# Patient Record
Sex: Female | Born: 1950 | Hispanic: No | Smoking: Current every day smoker
Health system: Southern US, Community
[De-identification: ages and names within clinical notes are randomized; demographics above are authoritative.]

## PROBLEM LIST (undated history)

## (undated) DIAGNOSIS — K589 Irritable bowel syndrome without diarrhea: Secondary | ICD-10-CM

## (undated) DIAGNOSIS — I700 Atherosclerosis of aorta: Secondary | ICD-10-CM

## (undated) HISTORY — DX: Morbid (severe) obesity due to excess calories: E66.01

## (undated) HISTORY — PX: WRIST SURGERY: SHX841

## (undated) HISTORY — PX: NEPHRECTOMY: SHX65

## (undated) HISTORY — PX: RADICAL HYSTERECTOMY: SHX2283

## (undated) HISTORY — DX: Atherosclerosis of aorta: I70.0

## (undated) HISTORY — DX: Irritable bowel syndrome, unspecified: K58.9

---

## 2020-02-18 ENCOUNTER — Inpatient Hospital Stay (HOSPITAL_COMMUNITY): Payer: Medicare Other

## 2020-02-18 ENCOUNTER — Inpatient Hospital Stay (HOSPITAL_COMMUNITY): Payer: Medicare Other | Admitting: Anesthesiology

## 2020-02-18 ENCOUNTER — Encounter (HOSPITAL_COMMUNITY): Payer: Self-pay | Admitting: Neurology

## 2020-02-18 ENCOUNTER — Encounter (HOSPITAL_COMMUNITY)
Admission: EM | Disposition: A | Discharge status: Home Health | Payer: Self-pay | Source: Home / Self Care | Attending: Neurology

## 2020-02-18 ENCOUNTER — Emergency Department (HOSPITAL_COMMUNITY): Payer: Medicare Other

## 2020-02-18 ENCOUNTER — Other Ambulatory Visit: Payer: Self-pay

## 2020-02-18 ENCOUNTER — Inpatient Hospital Stay (HOSPITAL_COMMUNITY)
Admission: EM | Admit: 2020-02-18 | Discharge: 2020-02-20 | DRG: 024 | Disposition: A | Discharge status: Home Health | Payer: Medicare Other | Attending: Neurology | Admitting: Neurology

## 2020-02-18 DIAGNOSIS — R29810 Facial weakness: Secondary | ICD-10-CM | POA: Diagnosis present

## 2020-02-18 DIAGNOSIS — Z7982 Long term (current) use of aspirin: Secondary | ICD-10-CM

## 2020-02-18 DIAGNOSIS — I6389 Other cerebral infarction: Secondary | ICD-10-CM | POA: Diagnosis not present

## 2020-02-18 DIAGNOSIS — E785 Hyperlipidemia, unspecified: Secondary | ICD-10-CM | POA: Diagnosis present

## 2020-02-18 DIAGNOSIS — R29703 NIHSS score 3: Secondary | ICD-10-CM | POA: Diagnosis present

## 2020-02-18 DIAGNOSIS — I6622 Occlusion and stenosis of left posterior cerebral artery: Secondary | ICD-10-CM

## 2020-02-18 DIAGNOSIS — I674 Hypertensive encephalopathy: Secondary | ICD-10-CM | POA: Diagnosis present

## 2020-02-18 DIAGNOSIS — I63432 Cerebral infarction due to embolism of left posterior cerebral artery: Principal | ICD-10-CM | POA: Diagnosis present

## 2020-02-18 DIAGNOSIS — Z8249 Family history of ischemic heart disease and other diseases of the circulatory system: Secondary | ICD-10-CM | POA: Diagnosis not present

## 2020-02-18 DIAGNOSIS — Z20822 Contact with and (suspected) exposure to covid-19: Secondary | ICD-10-CM | POA: Diagnosis present

## 2020-02-18 DIAGNOSIS — R4781 Slurred speech: Secondary | ICD-10-CM | POA: Diagnosis present

## 2020-02-18 DIAGNOSIS — Z91048 Other nonmedicinal substance allergy status: Secondary | ICD-10-CM

## 2020-02-18 DIAGNOSIS — M545 Low back pain: Secondary | ICD-10-CM | POA: Diagnosis present

## 2020-02-18 DIAGNOSIS — I639 Cerebral infarction, unspecified: Secondary | ICD-10-CM | POA: Diagnosis present

## 2020-02-18 DIAGNOSIS — F17210 Nicotine dependence, cigarettes, uncomplicated: Secondary | ICD-10-CM | POA: Diagnosis present

## 2020-02-18 DIAGNOSIS — G8191 Hemiplegia, unspecified affecting right dominant side: Secondary | ICD-10-CM | POA: Diagnosis present

## 2020-02-18 DIAGNOSIS — Z91018 Allergy to other foods: Secondary | ICD-10-CM

## 2020-02-18 DIAGNOSIS — Z79890 Hormone replacement therapy (postmenopausal): Secondary | ICD-10-CM

## 2020-02-18 DIAGNOSIS — I63339 Cerebral infarction due to thrombosis of unspecified posterior cerebral artery: Secondary | ICD-10-CM

## 2020-02-18 DIAGNOSIS — I63332 Cerebral infarction due to thrombosis of left posterior cerebral artery: Secondary | ICD-10-CM

## 2020-02-18 HISTORY — PX: IR PERCUTANEOUS ART THROMBECTOMY/INFUSION INTRACRANIAL INC DIAG ANGIO: IMG6087

## 2020-02-18 HISTORY — DX: Cerebral infarction, unspecified: I63.9

## 2020-02-18 HISTORY — DX: Occlusion and stenosis of left posterior cerebral artery: I66.22

## 2020-02-18 HISTORY — PX: RADIOLOGY WITH ANESTHESIA: SHX6223

## 2020-02-18 HISTORY — PX: IR CT HEAD LTD: IMG2386

## 2020-02-18 LAB — CBC
HCT: 46.3 % — ABNORMAL HIGH (ref 36.0–46.0)
Hemoglobin: 15 g/dL (ref 12.0–15.0)
MCH: 31.7 pg (ref 26.0–34.0)
MCHC: 32.4 g/dL (ref 30.0–36.0)
MCV: 97.9 fL (ref 80.0–100.0)
Platelets: 327 10*3/uL (ref 150–400)
RBC: 4.73 MIL/uL (ref 3.87–5.11)
RDW: 14.6 % (ref 11.5–15.5)
WBC: 8.1 10*3/uL (ref 4.0–10.5)
nRBC: 0 % (ref 0.0–0.2)

## 2020-02-18 LAB — DIFFERENTIAL
Abs Immature Granulocytes: 0.02 10*3/uL (ref 0.00–0.07)
Basophils Absolute: 0.1 10*3/uL (ref 0.0–0.1)
Basophils Relative: 1 %
Eosinophils Absolute: 0.1 10*3/uL (ref 0.0–0.5)
Eosinophils Relative: 2 %
Immature Granulocytes: 0 %
Lymphocytes Relative: 29 %
Lymphs Abs: 2.4 10*3/uL (ref 0.7–4.0)
Monocytes Absolute: 0.6 10*3/uL (ref 0.1–1.0)
Monocytes Relative: 7 %
Neutro Abs: 4.9 10*3/uL (ref 1.7–7.7)
Neutrophils Relative %: 61 %

## 2020-02-18 LAB — URINALYSIS, ROUTINE W REFLEX MICROSCOPIC
Bilirubin Urine: NEGATIVE
Glucose, UA: NEGATIVE mg/dL
Hgb urine dipstick: NEGATIVE
Ketones, ur: NEGATIVE mg/dL
Leukocytes,Ua: NEGATIVE
Nitrite: POSITIVE — AB
Protein, ur: NEGATIVE mg/dL
Specific Gravity, Urine: 1.033 — ABNORMAL HIGH (ref 1.005–1.030)
pH: 6 (ref 5.0–8.0)

## 2020-02-18 LAB — I-STAT CHEM 8, ED
BUN: 18 mg/dL (ref 8–23)
Calcium, Ion: 1.12 mmol/L — ABNORMAL LOW (ref 1.15–1.40)
Chloride: 107 mmol/L (ref 98–111)
Creatinine, Ser: 0.8 mg/dL (ref 0.44–1.00)
Glucose, Bld: 96 mg/dL (ref 70–99)
HCT: 45 % (ref 36.0–46.0)
Hemoglobin: 15.3 g/dL — ABNORMAL HIGH (ref 12.0–15.0)
Potassium: 4.8 mmol/L (ref 3.5–5.1)
Sodium: 138 mmol/L (ref 135–145)
TCO2: 23 mmol/L (ref 22–32)

## 2020-02-18 LAB — COMPREHENSIVE METABOLIC PANEL
ALT: 12 U/L (ref 0–44)
AST: 20 U/L (ref 15–41)
Albumin: 3.2 g/dL — ABNORMAL LOW (ref 3.5–5.0)
Alkaline Phosphatase: 79 U/L (ref 38–126)
Anion gap: 9 (ref 5–15)
BUN: 15 mg/dL (ref 8–23)
CO2: 24 mmol/L (ref 22–32)
Calcium: 9.2 mg/dL (ref 8.9–10.3)
Chloride: 105 mmol/L (ref 98–111)
Creatinine, Ser: 0.93 mg/dL (ref 0.44–1.00)
GFR calc Af Amer: >60 mL/min (ref >60)
GFR calc non Af Amer: >60 mL/min (ref >60)
Glucose, Bld: 101 mg/dL — ABNORMAL HIGH (ref 70–99)
Potassium: 4.8 mmol/L (ref 3.5–5.1)
Sodium: 138 mmol/L (ref 135–145)
Total Bilirubin: 0.5 mg/dL (ref 0.3–1.2)
Total Protein: 6.6 g/dL (ref 6.5–8.1)

## 2020-02-18 LAB — SARS CORONAVIRUS 2 BY RT PCR (HOSPITAL ORDER, PERFORMED IN ~~LOC~~ HOSPITAL LAB): SARS Coronavirus 2: NEGATIVE

## 2020-02-18 LAB — MRSA PCR SCREENING: MRSA by PCR: NEGATIVE

## 2020-02-18 LAB — APTT: aPTT: 25 seconds (ref 24–36)

## 2020-02-18 LAB — PROTIME-INR
INR: 0.9 (ref 0.8–1.2)
Prothrombin Time: 11.8 seconds (ref 11.4–15.2)

## 2020-02-18 LAB — CBG MONITORING, ED: Glucose-Capillary: 101 mg/dL — ABNORMAL HIGH (ref 70–99)

## 2020-02-18 SURGERY — IR WITH ANESTHESIA
Anesthesia: General

## 2020-02-18 MED ORDER — IOHEXOL 300 MG/ML  SOLN
150.0000 mL | Freq: Once | INTRAMUSCULAR | Status: AC | PRN
  Administered 2020-02-18: 100 mL via INTRA_ARTERIAL

## 2020-02-18 MED ORDER — SENNOSIDES-DOCUSATE SODIUM 8.6-50 MG PO TABS: 1.0000 | ORAL_TABLET | Freq: Every evening | ORAL | Status: DC | PRN

## 2020-02-18 MED ORDER — SODIUM CHLORIDE 0.9 % IV SOLN: INTRAVENOUS | Status: DC

## 2020-02-18 MED ORDER — TIROFIBAN HCL IN NACL 5-0.9 MG/100ML-% IV SOLN
INTRAVENOUS | Status: AC
  Filled 2020-02-18: qty 100

## 2020-02-18 MED ORDER — CLEVIDIPINE BUTYRATE 0.5 MG/ML IV EMUL
INTRAVENOUS | Status: AC
  Filled 2020-02-18: qty 50

## 2020-02-18 MED ORDER — CLEVIDIPINE BUTYRATE 0.5 MG/ML IV EMUL
INTRAVENOUS | Status: DC | PRN
  Administered 2020-02-18: 2 mg/h via INTRAVENOUS

## 2020-02-18 MED ORDER — CLOPIDOGREL BISULFATE 300 MG PO TABS
ORAL_TABLET | ORAL | Status: AC
  Filled 2020-02-18: qty 1

## 2020-02-18 MED ORDER — LIDOCAINE 2% (20 MG/ML) 5 ML SYRINGE
INTRAMUSCULAR | Status: DC | PRN
  Administered 2020-02-18: 60 mg via INTRAVENOUS

## 2020-02-18 MED ORDER — ACETAMINOPHEN 160 MG/5ML PO SOLN: 650.0000 mg | ORAL | Status: DC | PRN

## 2020-02-18 MED ORDER — EPTIFIBATIDE 20 MG/10ML IV SOLN
INTRAVENOUS | Status: AC
  Filled 2020-02-18: qty 10

## 2020-02-18 MED ORDER — ASPIRIN 81 MG PO CHEW
CHEWABLE_TABLET | ORAL | Status: AC
  Filled 2020-02-18: qty 1

## 2020-02-18 MED ORDER — FENTANYL CITRATE (PF) 100 MCG/2ML IJ SOLN
INTRAMUSCULAR | Status: AC
  Filled 2020-02-18: qty 2

## 2020-02-18 MED ORDER — STROKE: EARLY STAGES OF RECOVERY BOOK
Freq: Once | Status: DC
  Filled 2020-02-18: qty 1

## 2020-02-18 MED ORDER — ACETAMINOPHEN 325 MG PO TABS: 650.0000 mg | ORAL_TABLET | ORAL | Status: DC | PRN

## 2020-02-18 MED ORDER — NITROGLYCERIN 1 MG/10 ML FOR IR/CATH LAB
INTRA_ARTERIAL | Status: AC
  Administered 2020-02-18: 25 ug
  Filled 2020-02-18: qty 10

## 2020-02-18 MED ORDER — IOHEXOL 240 MG/ML SOLN
INTRAMUSCULAR | Status: AC
  Filled 2020-02-18: qty 200

## 2020-02-18 MED ORDER — ACETAMINOPHEN 650 MG RE SUPP: 650.0000 mg | RECTAL | Status: DC | PRN

## 2020-02-18 MED ORDER — VERAPAMIL HCL 2.5 MG/ML IV SOLN
INTRAVENOUS | Status: AC
  Filled 2020-02-18: qty 2

## 2020-02-18 MED ORDER — CEFAZOLIN SODIUM-DEXTROSE 2-3 GM-%(50ML) IV SOLR
INTRAVENOUS | Status: DC | PRN
  Administered 2020-02-18: 2 g via INTRAVENOUS

## 2020-02-18 MED ORDER — PHENYLEPHRINE HCL-NACL 10-0.9 MG/250ML-% IV SOLN
INTRAVENOUS | Status: DC | PRN
  Administered 2020-02-18: 25 ug/min via INTRAVENOUS

## 2020-02-18 MED ORDER — TICAGRELOR 90 MG PO TABS
ORAL_TABLET | ORAL | Status: AC
  Filled 2020-02-18: qty 2

## 2020-02-18 MED ORDER — IOHEXOL 300 MG/ML  SOLN: 50.0000 mL | Freq: Once | INTRAMUSCULAR | Status: DC | PRN

## 2020-02-18 MED ORDER — ONDANSETRON HCL 4 MG/2ML IJ SOLN
INTRAMUSCULAR | Status: DC | PRN
  Administered 2020-02-18: 4 mg via INTRAVENOUS

## 2020-02-18 MED ORDER — CLEVIDIPINE BUTYRATE 0.5 MG/ML IV EMUL
0.0000 mg/h | INTRAVENOUS | Status: DC
  Administered 2020-02-18: 2 mg/h via INTRAVENOUS
  Administered 2020-02-19: 6 mg/h via INTRAVENOUS
  Filled 2020-02-18 (×2): qty 50

## 2020-02-18 MED ORDER — LACTATED RINGERS IV SOLN: INTRAVENOUS | Status: DC | PRN

## 2020-02-18 MED ORDER — PHENYLEPHRINE 40 MCG/ML (10ML) SYRINGE FOR IV PUSH (FOR BLOOD PRESSURE SUPPORT)
PREFILLED_SYRINGE | INTRAVENOUS | Status: DC | PRN
  Administered 2020-02-18: 80 ug via INTRAVENOUS

## 2020-02-18 MED ORDER — CEFAZOLIN SODIUM-DEXTROSE 2-4 GM/100ML-% IV SOLN
INTRAVENOUS | Status: AC
  Filled 2020-02-18: qty 100

## 2020-02-18 MED ORDER — PROPOFOL 10 MG/ML IV BOLUS
INTRAVENOUS | Status: DC | PRN
  Administered 2020-02-18: 200 mg via INTRAVENOUS

## 2020-02-18 MED ORDER — ONDANSETRON HCL 4 MG/2ML IJ SOLN
INTRAMUSCULAR | Status: AC
  Filled 2020-02-18: qty 2

## 2020-02-18 MED ORDER — FENTANYL CITRATE (PF) 100 MCG/2ML IJ SOLN
INTRAMUSCULAR | Status: DC | PRN
  Administered 2020-02-18 (×2): 50 ug via INTRAVENOUS

## 2020-02-18 MED ORDER — ROCURONIUM BROMIDE 10 MG/ML (PF) SYRINGE
PREFILLED_SYRINGE | INTRAVENOUS | Status: DC | PRN
  Administered 2020-02-18: 50 mg via INTRAVENOUS
  Administered 2020-02-18: 20 mg via INTRAVENOUS

## 2020-02-18 MED ORDER — ACETAMINOPHEN 325 MG PO TABS
650.0000 mg | ORAL_TABLET | ORAL | Status: DC | PRN
  Administered 2020-02-18 – 2020-02-19 (×3): 650 mg via ORAL
  Filled 2020-02-18 (×3): qty 2

## 2020-02-18 MED ORDER — SUGAMMADEX SODIUM 200 MG/2ML IV SOLN
INTRAVENOUS | Status: DC | PRN
  Administered 2020-02-18: 400 mg via INTRAVENOUS

## 2020-02-18 MED ORDER — IOHEXOL 350 MG/ML SOLN
75.0000 mL | Freq: Once | INTRAVENOUS | Status: AC | PRN
  Administered 2020-02-18: 75 mL via INTRAVENOUS

## 2020-02-18 MED ORDER — SODIUM CHLORIDE 0.9% FLUSH: 3.0000 mL | Freq: Once | INTRAVENOUS | Status: DC

## 2020-02-18 NOTE — Anesthesia Postprocedure Evaluation (Signed)
Anesthesia Post Note  Patient: Emergency planning/management officer  Procedure(s) Performed: IR WITH ANESTHESIA (N/A )     Patient location during evaluation: ICU Anesthesia Type: General Level of consciousness: awake Pain management: pain level controlled Vital Signs Assessment: post-procedure vital signs reviewed and stable Respiratory status: spontaneous breathing Cardiovascular status: stable Postop Assessment: no apparent nausea or vomiting Anesthetic complications: no   No complications documented.  Last Vitals:  Vitals:   02/18/20 1645 02/18/20 1700  BP:    Pulse: 60   Resp: 13 11  Temp:    SpO2: (!) 85%     Last Pain:  Vitals:   02/18/20 1253  PainSc: 6                  Maria Green

## 2020-02-18 NOTE — Procedures (Addendum)
S/P hypoglossal arteriogram  Lt ICA  Followed by complete revascularization of occluded Lt PCA P1/P2  junction  With x 2 passes with Tiger17 retriever and x 1 pass with penumbra aspiration achieving a TICI 3 revascularization. Post CT brain No ICH  Or mas effect. 90F angioseal for hemostasis at the RT groin access site. Patient extubated . Moving all 4s and responding to simple commands. Also C/O severe low back pain. Vital s stable .  Plan.  stat CT of the abdomen and pelvis to R/O retro peritoneal bleed.Marland Kitchen S.Cornelius Schuitema MD . CT abdomen and pelvis neg for retroperitoneal hemorrhage. No other sig findings. Vital stable. Neurologically more awake alert. Responds to simple commands appropriately. Motor functionnearly equal in all 4s. ?RT VF deficit. S.Fountain Derusha MD

## 2020-02-18 NOTE — ED Triage Notes (Signed)
Pt BIB [facility] for eval of R sided arm weakness and R facial droop noted upon waking this AM. Pt LKN was last night at midnight before going to bed. Awoke w/ R sided deficits last night. No hx of same. CBG 100 w/ EMS

## 2020-02-18 NOTE — Progress Notes (Signed)
Patient arrived to 4N27 at 31.  Patient following commands and complaining of back pain.  Right groin level 0 and pulses palpable.  Patient I/O with full bladder.  1100 ml from I/O.  Patient's daughter called and arrived to bedside and updated.

## 2020-02-18 NOTE — Code Documentation (Signed)
Stroke Response Nurse Documentation Code Documentation  Maria Green is a 69 y.o. female arriving to [facility] ED via [facility] on 02/18/2020 with no known past medical hx per patient. Code stroke was activated by EMS. Patient from home where she lives by herself. She was LKW at 0000 on 02/18/2020 when she went to bed. Pt woke up this morning and reported that she felt a little off and she had some mild right sided weakness. Pt went into her kitchen, made coffee, and sat down in her recliner. Her daughter called and patient was crying. Daughter came over and reported right side weakness and called EMS. No medication besides Premarin after her Hysterectomy. Stroke team at the bedside on patient arrival. Labs drawn and patient cleared for CT by Dr. Lockie Mola. Patient to CT with team. NIHSS 3, see documentation for details and code stroke times. Patient with right arm weakness and right leg weakness on exam. The following imaging was completed:  CT, CTA head and neck, CTP. Patient is not a candidate for tPA due to being outside the window. After CTA reviewed by Dr. Derry Lory at 1240, a large vessel occlusion was noted. Due to patient's mild symptoms the decision was discussed with Dr. Corliss Skains.   At 1305, decision was made to move forward and speak with patient about potential endovascular procedure. Dr. Derry Lory consented patient and patient agreed to move forward at 1309.   1314 - Arrived to IR [facility]. Anesthesia intubated patient and patient transferred to IR Suite.   ![id number] - Handoff given to Vaughan Sine RN.   Lucila Maine  Stroke Response RN

## 2020-02-18 NOTE — H&P (Addendum)
Neurology H&P    CC: Right-sided weakness  History is obtained from: EMS and patient  HPI: Maria Green is a 69 y.o. female with no past medical history known.  Patient states that she was fine when she went to bed at approximately 12:00 last night.  When she woke up at 9 AM she noted that she was having severe difficulty walking as her right leg was weak.  She also tried to make some coffee with her right hand and dropped her coffee.  Upon EMS arrival she was complaining of a severe headache.  And had a slight right facial droop.  Blood pressure on arrival was 140/60.  Blood glucose was 100.  Patient was immediately brought to CT scan.  During that period of time patient became very emotional, and obtaining a IV was very difficult.  Patient had no other complaints other than headache and right-sided weakness.  Due to patient being out of the window CT a of head and neck along with perfusion was also obtained.  LKW: 0000 on 02/18/20 tpa given?: no, out of window Premorbid modified Rankin scale (mRS): 0 Thrombectomy: offered to the patient. I discussed with patient the noted occlusion in the L PCA and potential thrombectomy to attempt to open the occlusion with hope that she has some improvement in the R sided weakness and does not develop any hemianopsia. Discussed potential risks of intervention including a 15% risk of significant ICH, risk of potential death as a complication of the procedure, may not be able to open up the occlusion and she may require a stent placement to avoid re-occlusion. Patient was able to understand all the information presented and did ask specific questions regarding procedure including how long the opening may take and how long she will be in the hospital for. She was able to repeat back the risks to me. She consented to the procedure.  NIHSS 1a Level of Conscious.:0  1b LOC Questions: 0 1c LOC Commands: 0 2 Best Gaze: 0 3 Visual: 0 4 Facial Palsy: 0 5a Motor Arm  - left: 1 5b Motor Arm - Right:0  6a Motor Leg - Left: 2 6b Motor Leg - Right: 0 7 Limb Ataxia:  8 Sensory: 0 9 Best Language: 0 10 Dysarthria: 0 11 Extinct. and Inatten.: 0 TOTAL: 3   No past medical history on file.   Family History  Problem Relation Age of Onset  . Hypertension Mother   . Hypertension Father    Social History:   reports that she has been smoking cigarettes. She does not have any smokeless tobacco history on file. No history on file for alcohol use and drug use.  Medications  Current Facility-Administered Medications:  .  sodium chloride flush (NS) 0.9 % injection 3 mL, 3 mL, Intravenous, Once, [place], Adam, DO No current outpatient medications on file.  ROS:   General ROS: negative for - chills, fatigue, fever, night sweats, weight gain or weight loss Psychological ROS: negative for - behavioral disorder, hallucinations, memory difficulties, mood swings or suicidal ideation Ophthalmic ROS: negative for - blurry vision, double vision, eye pain or loss of vision ENT ROS: negative for - epistaxis, nasal discharge, oral lesions, sore throat, tinnitus or vertigo Allergy and Immunology ROS: negative for - hives or itchy/watery eyes Hematological and Lymphatic ROS: negative for - bleeding problems, bruising or swollen lymph nodes Endocrine ROS: negative for - galactorrhea, hair pattern changes, polydipsia/polyuria or temperature intolerance Respiratory ROS: negative for - cough, hemoptysis, shortness of  breath or wheezing Cardiovascular ROS: negative for - chest pain, dyspnea on exertion, edema or irregular heartbeat Gastrointestinal ROS: negative for - abdominal pain, diarrhea, hematemesis, nausea/vomiting or stool incontinence Genito-Urinary ROS: negative for - dysuria, hematuria, incontinence or urinary frequency/urgency Musculoskeletal ROS: Weakness for - muscular weakness Neurological ROS: as noted in HPI Dermatological ROS: negative for rash and skin  lesion changes  Exam: Current vital signs: There were no vitals taken for this visit. Vital signs in last 24 hours:     Constitutional: Appears well-developed and well-nourished.  Psych: Affect appropriate to situation Eyes: No scleral injection HENT: No OP obstrucion Head: Normocephalic.  Cardiovascular: Normal rate and regular rhythm.  Respiratory: Effort normal, non-labored breathing GI: Soft.  No distension. There is no tenderness.  Skin: WDI  Neuro: Mental Status: Patient is awake and oriented to person, age, birthdate, place.  Patient is able to name, comprehend, repeat.  Patient is able to follow commands without difficulty.  Speech shows no dysarthria or aphasia. Cranial Nerves: II: Visual Fields are full.  III,IV, VI: EOMI without ptosis or diploplia. Pupils equal, round and reactive to light V: Facial sensation is symmetric to temperature VII: Facial movement is symmetric.  VIII: hearing is intact to voice X: Palat elevates symmetrically XI: Shoulder shrug is symmetric. XII: tongue is midline without atrophy or fasciculations.  Motor: 4/5 strength in the right arm with drift, 2/5 strength with right leg unable to resist gravity.  5/5 strength on left arm and leg. Sensory: Sensation is symmetric to light touch and temperature in the arms and legs. Deep Tendon Reflexes: 2+ and symmetric in the biceps and patellae.  Plantars: Toes are downgoing bilaterally.  Cerebellar: Finger-nose and heel shin on the left were within normal limits.  Patient showed dysmetria with finger-nose on the right was likely secondary to strength.  Labs I have reviewed labs in epic and the results pertinent to this consultation are:  CBC    Component Value Date/Time   WBC 8.1 02/18/2020 1212   RBC 4.73 02/18/2020 1212   HGB 15.0 02/18/2020 1212   HCT 46.3 (H) 02/18/2020 1212   PLT 327 02/18/2020 1212   MCV 97.9 02/18/2020 1212   MCH 31.7 02/18/2020 1212   MCHC 32.4 02/18/2020 1212    RDW 14.6 02/18/2020 1212   LYMPHSABS 2.4 02/18/2020 1212   MONOABS 0.6 02/18/2020 1212   EOSABS 0.1 02/18/2020 1212   BASOSABS 0.1 02/18/2020 1212    CMP  No results found for: NA, K, CL, CO2, GLUCOSE, BUN, CREATININE, CALCIUM, PROT, ALBUMIN, AST, ALT, ALKPHOS, BILITOT, GFRNONAA, GFRAA  Lipid Panel  No results found for: CHOL, TRIG, HDL, CHOLHDL, VLDL, LDLCALC, LDLDIRECT   Imaging I have reviewed the images obtained:  CT-scan of the brain-left frontoparietal infarct, likely subacute.  MRI examination of the brain  Felicie Morn PA-C [facility] [phone]  M-F  (9:00 am- 5:00 PM)  02/18/2020, 12:44 PM     Assessment:  This is a 69 year old female who was brought to the hospital secondary to waking up with a right arm and right leg weakness. She went to bed at  Patient states she does not take aspirin daily but she does smoke on a daily basis.  Patient was out of the window for TPA thus CTA head and neck along with perfusion were obtained.  CT of head did show a left frontoparietal infarct that was likely subacute. CTA with L P2 occlusion and CTP with a mismatch in the L PCA territory. Thrombectomy  offered and patient agreed to it after discussion or risks and benefits.  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  Plan:  Acute Ischemic Stroke Acuity: Acute -Admit to: ICU -Blood pressure control, goal of SYS <Per IR -MRI/ECHO/A1C/Lipid panel. -Hyperglycemia management per SSI to maintain glucose 140-180mg /dL. -PT/OT/ST therapies and recommendations when able - CTA head and Neck with L PCA occlusion. - Telemetry monitoring for arrythmia - Swallow screen - Stroke education   CNS  -Close neuro monitoring  CV Hypertensive encephalopathy Essential (primary) hypertension Hypertensive Emergency Hypertensive Urgency -Aggressive BP control, goal SBP < PER IR   Hyperlipidemia, unspecified  - Statin for goal LDL < 70 -Lipitor  80 mg daily  Prophylaxis DVT:  SCD GI: protonix Bowel: senna  Diet: NPO until cleared by speech  Code Status: Full Code    This patient is critically ill and at significant risk of neurological worsening, death and care requires constant monitoring of vital signs, hemodynamics,respiratory and cardiac monitoring, neurological assessment, discussion with family, other specialists and medical decision making of high complexity. I spent 40 minutes of neurocritical care time  in the care of  this patient. This was time spent independent of any time provided by nurse practitioner or PA.  Erick Blinks [facility] Pager Number [phone] 02/18/2020  1:50 PM

## 2020-02-18 NOTE — Anesthesia Procedure Notes (Signed)
Procedure Name: Intubation Date/Time: 02/18/2020 1:30 PM Performed by: Griffin Dakin, CRNA Pre-anesthesia Checklist: Patient identified, Emergency Drugs available, Suction available, Patient being monitored and Timeout performed Patient Re-evaluated:Patient Re-evaluated prior to induction Oxygen Delivery Method: Circle system utilized Preoxygenation: Pre-oxygenation with 100% oxygen Induction Type: IV induction Ventilation: Mask ventilation without difficulty Laryngoscope Size: Mac, Glidescope and 3 Grade View: Grade I Tube type: Oral Tube size: 7.5 mm Number of attempts: 1 Airway Equipment and Method: Stylet Placement Confirmation: ETT inserted through vocal cords under direct vision,  positive ETCO2 and breath sounds checked- equal and bilateral Secured at: 22 cm Tube secured with: Tape Dental Injury: Teeth and Oropharynx as per pre-operative assessment

## 2020-02-18 NOTE — ED Provider Notes (Signed)
[facility] EMERGENCY DEPARTMENT Provider Note   CSN: [id number] Arrival date & time: 02/18/20  1207  An emergency department physician performed an initial assessment on this suspected stroke patient at 1209.  History Chief Complaint  Patient presents with  . Code Stroke    Maria Green is a 69 y.o. female.  The history is provided by the patient and the EMS personnel.  Neurologic Problem This is a new problem. Episode onset: Woke up at 9am with right sided weakness, last normal last night before bed at midnight. Diffifculty walking this morning. EMS states right facial droop, right arm and leg weakness, slurred speech. The problem occurs constantly. The problem has not changed since onset.Pertinent negatives include no chest pain, no abdominal pain, no headaches and no shortness of breath. Nothing aggravates the symptoms. Nothing relieves the symptoms. She has tried nothing for the symptoms. The treatment provided no relief.       History reviewed. No pertinent past medical history.  Patient Active Problem List   Diagnosis Date Noted  . Stroke (HCC) 02/18/2020     OB History   No obstetric history on file.     Family History  Problem Relation Age of Onset  . Hypertension Mother   . Hypertension Father     Social History   Tobacco Use  . Smoking status: Current Some Day Smoker    Types: Cigarettes  . Smokeless tobacco: Never Used  . Tobacco comment: 12 years  Substance Use Topics  . Alcohol use: Not Currently  . Drug use: Not Currently    Home Medications Prior to Admission medications   Not on File    Allergies    Patient has no known allergies.  Review of Systems   Review of Systems  Constitutional: Negative for chills and fever.  HENT: Negative for ear pain and sore throat.   Eyes: Negative for pain and visual disturbance.  Respiratory: Negative for cough and shortness of breath.   Cardiovascular: Negative for chest pain and  palpitations.  Gastrointestinal: Negative for abdominal pain and vomiting.  Genitourinary: Negative for dysuria and hematuria.  Musculoskeletal: Negative for arthralgias and back pain.  Skin: Negative for color change and rash.  Neurological: Positive for facial asymmetry, speech difficulty and weakness. Negative for seizures, syncope and headaches.  All other systems reviewed and are negative.   Physical Exam Updated Vital Signs BP (!) 146/91   Pulse 77   Temp 97.7 F (36.5 C)   Resp 18   Ht  (1.626 m)   SpO2 99%   Physical Exam Vitals and nursing note reviewed.  Constitutional:      General: She is not in acute distress.    Appearance: She is well-developed. She is not ill-appearing.  HENT:     Head: Normocephalic and atraumatic.     Nose: Nose normal.     Mouth/Throat:     Mouth: Mucous membranes are moist.  Eyes:     Extraocular Movements: Extraocular movements intact.     Conjunctiva/sclera: Conjunctivae normal.     Pupils: Pupils are equal, round, and reactive to light.  Cardiovascular:     Rate and Rhythm: Normal rate and regular rhythm.     Pulses: Normal pulses.     Heart sounds: Normal heart sounds. No murmur heard.   Pulmonary:     Effort: Pulmonary effort is normal. No respiratory distress.     Breath sounds: Normal breath sounds.  Abdominal:     Palpations:  Abdomen is soft.     Tenderness: There is no abdominal tenderness.  Musculoskeletal:        General: Normal range of motion.     Cervical back: Normal range of motion and neck supple.  Skin:    General: Skin is warm and dry.  Neurological:     Mental Status: She is alert.     Motor: Weakness present.     Comments: Right-sided facial droop, right upper extremity weakness about 3+ out of 5, 2+ out of 5 weakness in the right lower extremity, 5+ out of 5 strength of the left upper and left lower extremity  Psychiatric:        Mood and Affect: Mood normal.     ED Results / Procedures /  Treatments   Labs (all labs ordered are listed, but only abnormal results are displayed) Labs Reviewed  CBC - Abnormal; Notable for the following components:      Result Value   HCT 46.3 (*)    All other components within normal limits  COMPREHENSIVE METABOLIC PANEL - Abnormal; Notable for the following components:   Glucose, Bld 101 (*)    Albumin 3.2 (*)    All other components within normal limits  CBG MONITORING, ED - Abnormal; Notable for the following components:   Glucose-Capillary 101 (*)    All other components within normal limits  I-STAT CHEM 8, ED - Abnormal; Notable for the following components:   Calcium, Ion 1.12 (*)    Hemoglobin 15.3 (*)    All other components within normal limits  SARS CORONAVIRUS 2 BY RT PCR (HOSPITAL ORDER, PERFORMED IN [facility])  PROTIME-INR  APTT  DIFFERENTIAL  URINALYSIS, ROUTINE W REFLEX MICROSCOPIC  HIV ANTIBODY (ROUTINE TESTING W REFLEX)  CBG MONITORING, ED    EKG EKG Interpretation  Date/Time:  Tuesday February 18 2020 12:44:02 EDT Ventricular Rate:  69 PR Interval:    QRS Duration: 82 QT Interval:  414 QTC Calculation: 444 R Axis:   3 Text Interpretation: Sinus rhythm Low voltage, precordial leads Abnormal R-wave progression, early transition Baseline wander in lead(s) V3 Confirmed by Virgina Norfolk [phone]) on 02/18/2020 1:01:26 PM   Radiology CT CEREBRAL PERFUSION W CONTRAST  Result Date: 02/18/2020 CLINICAL DATA:  Neuro deficit EXAM: CT PERFUSION BRAIN TECHNIQUE: Multiphase CT imaging of the brain was performed following IV bolus contrast injection. Subsequent parametric perfusion maps were calculated using RAPID software. CONTRAST:  10mL OMNIPAQUE IOHEXOL 350 MG/ML SOLN COMPARISON:  Prior same day noncontrast head CT. FINDINGS: CT Brain Perfusion Findings: CBF (<30%) Volume: 40mL Perfusion (Tmax>6.0s) volume: 14mL Mismatch Volume: 35mL ASPECTS on noncontrast CT Head: 9 at 12:32 p.m. today. Infarct Core: 0 mL  Infarction Location:No infarct core demonstrated on perfusion imaging. IMPRESSION: Ischemia involving the left parietal and occipitotemporal regions. No core identified on perfusion imaging. Electronically Signed   By: Stana Bunting M.D.   On: 02/18/2020 12:51   CT HEAD CODE STROKE WO CONTRAST  Result Date: 02/18/2020 CLINICAL DATA:  Code stroke.  Neuro deficit EXAM: CT HEAD WITHOUT CONTRAST TECHNIQUE: Contiguous axial images were obtained from the base of the skull through the vertex without intravenous contrast. COMPARISON:  None. FINDINGS: Brain: No intracranial hemorrhage. Hypodense focus extending into the cortex within the left frontoparietal region is concerning for subacute insult (5:47). No mass lesion. No midline shift, ventriculomegaly or extra-axial fluid collection. Vascular: No hyperdense vessel or unexpected calcification. Skull: Negative for fracture or focal lesion. Sinuses/Orbits: No acute finding. Other:  None. ASPECTS (Alberta Stroke Program Early CT Score) - Ganglionic level infarction (caudate, lentiform nuclei, internal capsule, insula, M1-M3 cortex): 6 - Supraganglionic infarction (M4-M6 cortex): 3 Total score (0-10 with 10 being normal): 9 IMPRESSION: 1. 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 2. ASPECTS is 9 Code stroke imaging results were communicated on 02/18/2020 at 12:23 pm to provider Dr. Ezzie DuralSal Via telephone, who verbally acknowledged these results. Electronically Signed   By: Stana Buntinghikanele  Emekauwa M.D.   On: 02/18/2020 12:32   CT ANGIO HEAD CODE STROKE  Result Date: 02/18/2020 CLINICAL DATA:  Neuro deficit EXAM: CT ANGIOGRAPHY HEAD AND NECK TECHNIQUE: Multidetector CT imaging of the head and neck was performed using the standard protocol during bolus administration of intravenous contrast. Multiplanar CT image reconstructions and MIPs were obtained to evaluate the vascular anatomy. Carotid stenosis measurements (when applicable) are obtained utilizing NASCET criteria,  using the distal internal carotid diameter as the denominator. CONTRAST:  75mL OMNIPAQUE IOHEXOL 350 MG/ML SOLN COMPARISON:  Concurrent noncontrast head CT and CT cerebral perfusion. FINDINGS: CTA NECK FINDINGS Aortic arch: Standard branching. Imaged portion shows no evidence of aneurysm or dissection. No significant stenosis of the major arch vessel origins. Right carotid system: No evidence of dissection, stenosis (50% or greater) or occlusion. Retropharyngeal course of the common and internal carotid arteries. Left carotid system: No evidence of dissection, stenosis (50% or greater) or occlusion. Retropharyngeal common carotid artery. Mild bifurcation atherosclerotic calcifications without significant luminal narrowing. Small web at the level of the bifurcation (6:27). Vertebral arteries: Codominant and diminutive. No evidence of dissection, stenosis (50% or greater) or occlusion. Skeleton: Multilevel spondylosis.  No acute osseous abnormality. Other neck: 2.4 x 1.7 cm right supraclavicular lesion, nonenhancing on arterial phase imaging (4:62). Bilateral thyroid hypoenhancing foci measuring up to 0.5 cm. No adenopathy by size criteria. Upper chest: Atelectasis. Review of the MIP images confirms the above findings CTA HEAD FINDINGS Anterior circulation: Normal caliber patent appearance of the bilateral internal, anterior and middle cerebral arteries. The left PCOM may be hypoplastic or absent. No significant stenosis, proximal occlusion, aneurysm, or vascular malformation. Posterior circulation: Diminutive, codominant vertebral arteries. Persistent left hypoglossal artery. Normal caliber patent basilar artery. Dominant right AICA. Proximally patent bilateral superior cerebellar arteries. Patent normal caliber right posterior cerebral artery. There is occlusion of the distal left P2 segment (3:119) with non opacification distally. Venous sinuses: As permitted by contrast timing, patent. Anatomic variants:  Persistent left hypoglossal artery (4:201). Review of the MIP images confirms the above findings IMPRESSION: Distal left P2 segment occlusion. No large vessel occlusion or high-grade narrowing involving the anterior circulation. Persistent left hypoglossal artery terminating as the basilar. Diminutive vertebral arteries. Small left carotid web at the level of the bifurcation. 2.4 cm right supraclavicular lesion is indeterminate. Outpatient workup with CT neck soft tissue with contrast is recommended. Code stroke imaging results were communicated on 02/18/2020 at 12:53 pm to provider Dr. Ezzie DuralSal Via telephone, who verbally acknowledged these results. Electronically Signed   By: Stana Buntinghikanele  Emekauwa M.D.   On: 02/18/2020 13:21   CT ANGIO NECK CODE STROKE  Result Date: 02/18/2020 CLINICAL DATA:  Neuro deficit EXAM: CT ANGIOGRAPHY HEAD AND NECK TECHNIQUE: Multidetector CT imaging of the head and neck was performed using the standard protocol during bolus administration of intravenous contrast. Multiplanar CT image reconstructions and MIPs were obtained to evaluate the vascular anatomy. Carotid stenosis measurements (when applicable) are obtained utilizing NASCET criteria, using the distal internal carotid diameter as the denominator. CONTRAST:  75mL OMNIPAQUE IOHEXOL 350  MG/ML SOLN COMPARISON:  Concurrent noncontrast head CT and CT cerebral perfusion. FINDINGS: CTA NECK FINDINGS Aortic arch: Standard branching. Imaged portion shows no evidence of aneurysm or dissection. No significant stenosis of the major arch vessel origins. Right carotid system: No evidence of dissection, stenosis (50% or greater) or occlusion. Retropharyngeal course of the common and internal carotid arteries. Left carotid system: No evidence of dissection, stenosis (50% or greater) or occlusion. Retropharyngeal common carotid artery. Mild bifurcation atherosclerotic calcifications without significant luminal narrowing. Small web at the level of the  bifurcation (6:27). Vertebral arteries: Codominant and diminutive. No evidence of dissection, stenosis (50% or greater) or occlusion. Skeleton: Multilevel spondylosis.  No acute osseous abnormality. Other neck: 2.4 x 1.7 cm right supraclavicular lesion, nonenhancing on arterial phase imaging (4:62). Bilateral thyroid hypoenhancing foci measuring up to 0.5 cm. No adenopathy by size criteria. Upper chest: Atelectasis. Review of the MIP images confirms the above findings CTA HEAD FINDINGS Anterior circulation: Normal caliber patent appearance of the bilateral internal, anterior and middle cerebral arteries. The left PCOM may be hypoplastic or absent. No significant stenosis, proximal occlusion, aneurysm, or vascular malformation. Posterior circulation: Diminutive, codominant vertebral arteries. Persistent left hypoglossal artery. Normal caliber patent basilar artery. Dominant right AICA. Proximally patent bilateral superior cerebellar arteries. Patent normal caliber right posterior cerebral artery. There is occlusion of the distal left P2 segment (3:119) with non opacification distally. Venous sinuses: As permitted by contrast timing, patent. Anatomic variants: Persistent left hypoglossal artery (4:201). Review of the MIP images confirms the above findings IMPRESSION: Distal left P2 segment occlusion. No large vessel occlusion or high-grade narrowing involving the anterior circulation. Persistent left hypoglossal artery terminating as the basilar. Diminutive vertebral arteries. Small left carotid web at the level of the bifurcation. 2.4 cm right supraclavicular lesion is indeterminate. Outpatient workup with CT neck soft tissue with contrast is recommended. Code stroke imaging results were communicated on 02/18/2020 at 12:53 pm to provider Dr. Ezzie Dural Via telephone, who verbally acknowledged these results. Electronically Signed   By: Stana Bunting M.D.   On: 02/18/2020 13:21    Procedures .Critical Care Performed  by: Virgina Norfolk, DO Authorized by: Virgina Norfolk, DO   Critical care provider statement:    Critical care time (minutes):  35   Critical care was necessary to treat or prevent imminent or life-threatening deterioration of the following conditions:  CNS failure or compromise   Critical care was time spent personally by me on the following activities:  Development of treatment plan with patient or surrogate, blood draw for specimens, evaluation of patient's response to treatment, discussions with primary provider, examination of patient, obtaining history from patient or surrogate, ordering and performing treatments and interventions, ordering and review of laboratory studies, ordering and review of radiographic studies, pulse oximetry, re-evaluation of patient's condition and review of old charts   I assumed direction of critical care for this patient from another provider in my specialty: no     (including critical care time)  Medications Ordered in ED Medications  sodium chloride flush (NS) 0.9 % injection 3 mL (3 mLs Intravenous Not Given 02/18/20 1301)   stroke: mapping our early stages of recovery book (has no administration in time range)  0.9 %  sodium chloride infusion (has no administration in time range)  acetaminophen (TYLENOL) tablet 650 mg (has no administration in time range)    Or  acetaminophen (TYLENOL) 160 MG/5ML solution 650 mg (has no administration in time range)    Or  acetaminophen (TYLENOL)  suppository 650 mg (has no administration in time range)  senna-docusate (Senokot-S) tablet 1 tablet (has no administration in time range)  iohexol (OMNIPAQUE) 240 MG/ML injection (has no administration in time range)  aspirin 81 MG chewable tablet (has no administration in time range)  verapamil (ISOPTIN) 2.5 MG/ML injection (has no administration in time range)  ticagrelor (BRILINTA) 90 MG tablet (has no administration in time range)  clopidogrel (PLAVIX) 300 MG tablet (has no  administration in time range)  nitroGLYCERIN 100 mcg/mL intra-arterial injection (has no administration in time range)  tirofiban (AGGRASTAT) 5-0.9 MG/100ML-% injection (has no administration in time range)  eptifibatide (INTEGRILIN) 20 MG/10ML injection (has no administration in time range)  iohexol (OMNIPAQUE) 300 MG/ML solution 50 mL (has no administration in time range)  iohexol (OMNIPAQUE) 300 MG/ML solution 150 mL (has no administration in time range)  ceFAZolin (ANCEF) 2-4 GM/100ML-% IVPB (has no administration in time range)  iohexol (OMNIPAQUE) 350 MG/ML injection 75 mL (75 mLs Intravenous Contrast Given 02/18/20 1243)    ED Course  I have reviewed the triage vital signs and the nursing notes.  Pertinent labs & imaging results that were available during my care of the patient were reviewed by me and considered in my medical decision making (see chart for details).    MDM Rules/Calculators/A&P                          Maria Green is a 69 year old female with no significant medical history presents the ED with strokelike symptoms.  Normal vitals.  No fever.  Right-sided weakness that started at 9 AM.  Last known normal was last night at midnight.  Patient arrives with right-sided arm and leg weakness.  Right facial droop.  Patient does have a distal left P2 segment occlusion on CT imaging.  Not a candidate for TPA because outside of the window however patient is a candidate for IR intervention and went directly to IR with neurology.  Patient with otherwise unremarkable lab work.  No significant anemia, electrolyte abnormality, kidney injury.  EKG shows sinus rhythm.  Family was updated.  Patient admitted to neurology team.  This chart was dictated using voice recognition software.  Despite best efforts to proofread,  errors can occur which can change the documentation meaning.    Final Clinical Impression(s) / ED Diagnoses Final diagnoses:  Stroke [facility])    Rx / DC Orders ED  Discharge Orders    None       Virgina Norfolk, DO 02/18/20 1437

## 2020-02-18 NOTE — Anesthesia Procedure Notes (Signed)
Arterial Line Insertion Start/End8/31/2021 1:40 PM, 02/18/2020 1:50 PM Performed by: Leonides Grills, MD, anesthesiologist  Patient location: OOR procedure area. Preanesthetic checklist: patient identified, IV checked, site marked, risks and benefits discussed, surgical consent, monitors and equipment checked, pre-op evaluation, timeout performed and anesthesia consent Left, radial was placed Catheter size: 20 Fr Hand hygiene performed , maximum sterile barriers used  and Seldinger technique used  Attempts: 1 (Previous attempts by CRNA more distal) Procedure performed using ultrasound guided technique. Ultrasound Notes:anatomy identified, needle tip was noted to be adjacent to the nerve/plexus identified and no ultrasound evidence of intravascular and/or intraneural injection Following insertion, dressing applied and Biopatch. Post procedure assessment: normal and unchanged  Patient tolerated the procedure well with no immediate complications.

## 2020-02-18 NOTE — Transfer of Care (Signed)
Immediate Anesthesia Transfer of Care Note  Patient: Maria Green  Procedure(s) Performed: IR WITH ANESTHESIA (N/A )  Patient Location: ICU  Anesthesia Type:General  Level of Consciousness: awake, alert  and oriented  Airway & Oxygen Therapy: Patient Spontanous Breathing and Patient connected to face mask oxygen  Post-op Assessment: Report given to RN and Post -op Vital signs reviewed and stable  Post vital signs: Reviewed and stable  Last Vitals:  Vitals Value Taken Time  BP    Temp    Pulse 60 02/18/20 1645  Resp 13 02/18/20 1645  SpO2 85 % 02/18/20 1645  Vitals shown include unvalidated device data.  Last Pain:  Vitals:   02/18/20 1253  PainSc: 6          Complications: No complications documented.

## 2020-02-18 NOTE — Sedation Documentation (Signed)
Patient transported to 4N27 by Elpidio Galea and Lafonda Mosses CRNA. Report given to [facility].

## 2020-02-18 NOTE — Progress Notes (Signed)
Patient ID: Maria Green, female   DOB: 1951/06/08, 69 y.o.   MRN: [id number] INR. 32 Y RT F LSW last night. MRSS 0 New onset Rt leg  And RT arm weakness.  CT brain NO ICH ASPECTS 9. CTA occluded Lt PCA P1/P2 junction. CTP penumbra in the Lt PCA distribution extending into temp parietal region.. Endovascular treatment of the Lt PCA D/W the patient by the neurohospitalist.. Procedure,reasons and alternatives explained. Risks of ICH of 10 to 15 %,worsening neuro deficit death ,inability to revascularize reviewed . Patient expressed understanding  and provided  consent to the treatment. S.Neale Marzette MD

## 2020-02-18 NOTE — Sedation Documentation (Signed)
Patient arrives to IR 2 via stretcher intubated accompanied by anesthesia. Anesthesia to continue care and monitoring throughout procedure.

## 2020-02-18 NOTE — Anesthesia Preprocedure Evaluation (Addendum)
Anesthesia Evaluation  Patient identified by MRN, date of birth, ID band Patient awake    Reviewed: Allergy & Precautions, Patient's Chart, lab work & pertinent test resultsPreop documentation limited or incomplete due to emergent nature of procedure.  Airway Mallampati: II  TM Distance: >3 FB Neck ROM: Full    Dental no notable dental hx.    Pulmonary Current Smoker,    Pulmonary exam normal        Cardiovascular negative cardio ROS Normal cardiovascular exam     Neuro/Psych CVA negative psych ROS   GI/Hepatic negative GI ROS, Neg liver ROS,   Endo/Other  negative endocrine ROS  Renal/GU negative Renal ROS     Musculoskeletal negative musculoskeletal ROS (+)   Abdominal   Peds  Hematology negative hematology ROS (+)   Anesthesia Other Findings code stroke  Reproductive/Obstetrics                            Anesthesia Physical Anesthesia Plan  ASA: III and emergent  Anesthesia Plan: General   Post-op Pain Management:    Induction: Intravenous  PONV Risk Score and Plan: 2 and Ondansetron, Dexamethasone and Treatment may vary due to age or medical condition  Airway Management Planned: Oral ETT  Additional Equipment: Arterial line  Intra-op Plan:   Post-operative Plan: Extubation in OR  Informed Consent:     Only emergency history available  Plan Discussed with: CRNA  Anesthesia Plan Comments:         Anesthesia Quick Evaluation

## 2020-02-19 ENCOUNTER — Inpatient Hospital Stay (HOSPITAL_COMMUNITY): Payer: Medicare Other

## 2020-02-19 ENCOUNTER — Encounter (HOSPITAL_COMMUNITY): Payer: Self-pay | Admitting: Radiology

## 2020-02-19 DIAGNOSIS — I6389 Other cerebral infarction: Secondary | ICD-10-CM

## 2020-02-19 DIAGNOSIS — I6622 Occlusion and stenosis of left posterior cerebral artery: Secondary | ICD-10-CM

## 2020-02-19 LAB — CBC WITH DIFFERENTIAL/PLATELET
Abs Immature Granulocytes: 0.02 10*3/uL (ref 0.00–0.07)
Basophils Absolute: 0 10*3/uL (ref 0.0–0.1)
Basophils Relative: 1 %
Eosinophils Absolute: 0.1 10*3/uL (ref 0.0–0.5)
Eosinophils Relative: 1 %
HCT: 40.8 % (ref 36.0–46.0)
Hemoglobin: 13 g/dL (ref 12.0–15.0)
Immature Granulocytes: 0 %
Lymphocytes Relative: 23 %
Lymphs Abs: 1.9 10*3/uL (ref 0.7–4.0)
MCH: 31 pg (ref 26.0–34.0)
MCHC: 31.9 g/dL (ref 30.0–36.0)
MCV: 97.1 fL (ref 80.0–100.0)
Monocytes Absolute: 0.6 10*3/uL (ref 0.1–1.0)
Monocytes Relative: 7 %
Neutro Abs: 5.9 10*3/uL (ref 1.7–7.7)
Neutrophils Relative %: 68 %
Platelets: 292 10*3/uL (ref 150–400)
RBC: 4.2 MIL/uL (ref 3.87–5.11)
RDW: 14.6 % (ref 11.5–15.5)
WBC: 8.6 10*3/uL (ref 4.0–10.5)
nRBC: 0 % (ref 0.0–0.2)

## 2020-02-19 LAB — LIPID PANEL
Cholesterol: 186 mg/dL (ref 0–200)
HDL: 65 mg/dL (ref >40)
LDL Cholesterol: 93 mg/dL (ref 0–99)
Total CHOL/HDL Ratio: 2.9 RATIO
Triglycerides: 142 mg/dL (ref <150)
VLDL: 28 mg/dL (ref 0–40)

## 2020-02-19 LAB — BASIC METABOLIC PANEL
Anion gap: 12 (ref 5–15)
BUN: 8 mg/dL (ref 8–23)
CO2: 16 mmol/L — ABNORMAL LOW (ref 22–32)
Calcium: 9.2 mg/dL (ref 8.9–10.3)
Chloride: 112 mmol/L — ABNORMAL HIGH (ref 98–111)
Creatinine, Ser: 0.66 mg/dL (ref 0.44–1.00)
GFR calc Af Amer: >60 mL/min (ref >60)
GFR calc non Af Amer: >60 mL/min (ref >60)
Glucose, Bld: 95 mg/dL (ref 70–99)
Potassium: 4.2 mmol/L (ref 3.5–5.1)
Sodium: 140 mmol/L (ref 135–145)

## 2020-02-19 LAB — HIV ANTIBODY (ROUTINE TESTING W REFLEX): HIV Screen 4th Generation wRfx: NONREACTIVE

## 2020-02-19 LAB — HEMOGLOBIN A1C
Hgb A1c MFr Bld: 5.3 % (ref 4.8–5.6)
Mean Plasma Glucose: 105.41 mg/dL

## 2020-02-19 LAB — ECHOCARDIOGRAM COMPLETE
Area-P 1/2: 3.99 cm2
Height: 64 in
S' Lateral: 3.1 cm

## 2020-02-19 MED ORDER — CLOPIDOGREL BISULFATE 75 MG PO TABS
75.0000 mg | ORAL_TABLET | Freq: Every day | ORAL | Status: DC
  Administered 2020-02-19 – 2020-02-20 (×2): 75 mg via ORAL
  Filled 2020-02-19 (×2): qty 1

## 2020-02-19 MED ORDER — ASPIRIN EC 81 MG PO TBEC
81.0000 mg | DELAYED_RELEASE_TABLET | Freq: Every day | ORAL | Status: DC
  Administered 2020-02-19 – 2020-02-20 (×2): 81 mg via ORAL
  Filled 2020-02-19 (×2): qty 1

## 2020-02-19 MED ORDER — ATORVASTATIN CALCIUM 40 MG PO TABS
40.0000 mg | ORAL_TABLET | Freq: Every day | ORAL | Status: DC
  Administered 2020-02-19 – 2020-02-20 (×2): 40 mg via ORAL
  Filled 2020-02-19 (×2): qty 1

## 2020-02-19 MED ORDER — CHLORHEXIDINE GLUCONATE CLOTH 2 % EX PADS
6.0000 | MEDICATED_PAD | Freq: Every day | CUTANEOUS | Status: DC
  Administered 2020-02-19 – 2020-02-20 (×2): 6 via TOPICAL

## 2020-02-19 MED ORDER — PERFLUTREN LIPID MICROSPHERE
1.0000 mL | INTRAVENOUS | Status: AC | PRN
  Administered 2020-02-19: 2 mL via INTRAVENOUS
  Filled 2020-02-19: qty 10

## 2020-02-19 MED ORDER — ENOXAPARIN SODIUM 40 MG/0.4ML ~~LOC~~ SOLN
40.0000 mg | SUBCUTANEOUS | Status: DC
  Administered 2020-02-19: 40 mg via SUBCUTANEOUS
  Filled 2020-02-19 (×2): qty 0.4

## 2020-02-19 NOTE — Discharge Instructions (Addendum)
Femoral Site Care This sheet gives you information about how to care for yourself after your procedure. Your health care provider may also give you more specific instructions. If you have problems or questions, contact your health care provider. What can I expect after the procedure? After the procedure, it is common to have:  Bruising that usually fades within 1-2 weeks.  Tenderness at the site. Follow these instructions at home: Wound care 1. Follow instructions from your health care provider about how to take care of your insertion site. Make sure you: ? Wash your hands with soap and water before you change your bandage (dressing). If soap and water are not available, use hand sanitizer. ? Change your dressing as directed- pressure dressing removed 24 hours post-procedure (and switch for bandaid), bandaid removed 72 hours post-procedure 2. Do not take baths, swim, or use a hot tub for 7 days post-procedure. 3. You may shower 48 hours after the procedure or as told by your health care provider. ? Gently wash the site with plain soap and water. ? Pat the area dry with a clean towel. ? Do not rub the site. This may cause bleeding. 4. Check your femoral site every day for signs of infection. Check for: ? Redness, swelling, or pain. ? Fluid or blood. ? Warmth. ? Pus or a bad smell. Activity  Do not stoop, bend, or lift anything that is heavier than 10 lb (4.5 kg) for 2 weeks post-procedure.  Do not drive self for 2 weeks post-procedure. Contact a health care provider if you have:  A fever or chills.  You have redness, swelling, or pain around your insertion site. Get help right away if:  The catheter insertion area swells very fast.  You pass out.  You suddenly start to sweat or your skin gets clammy.  The catheter insertion area is bleeding, and the bleeding does not stop when you hold steady pressure on the area.  The area near or just beyond the catheter insertion site  becomes pale, cool, tingly, or numb. These symptoms may represent a serious problem that is an emergency. Do not wait to see if the symptoms will go away. Get medical help right away. Call your local emergency services (911 in the [place] Do not drive yourself to the hospital.  This information is not intended to replace advice given to you by your health care provider. Make sure you discuss any questions you have with your health care provider. Document Revised: 06/19/2017 Document Reviewed: 06/19/2017 Elsevier Patient Education  2020 [employer] Inc[employer]   Care After Your Loop Recorder  . You have a [employer] Loop Recorder   . Monitor your cardiac device site for redness, swelling, and drainage. Call the device clinic at [phone] if you experience these symptoms or fever/chills.  . You may shower 3 days after your loop recorder implant and wash your incision with soap and water. Avoid lotions, ointments, or perfumes over your incision until it is well-healed.  . You may use a hot tub or a pool AFTER your wound check appointment if the incision is completely closed.  . Your device is MRI compatible.   . Remote monitoring is used to monitor your cardiac device from home. This monitoring is scheduled every month days by our office. It allows us to keep an eye on the functioning of your device to ensure it is working properly.   

## 2020-02-19 NOTE — Progress Notes (Signed)
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 Corliss Skains. Patient awake and alert laying in bed with no complaints at this time. Can spontaneously move all extremities. Right femoral puncture site c/d/i.  MR brain this AM: 1. Multifocal acute ischemia within the left PCA territory, predominantly in the medial left temporal lobe. No hemorrhage or mass effect. 2. 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   Allergies: Tape and Bean pod extract  Medications: Prior to Admission medications   Medication Sig Start Date End Date Taking? Authorizing Provider  aspirin EC 325 MG tablet Take 325 mg by mouth as needed (for headaches).   Yes [provider]  estrogens, conjugated, (PREMARIN) 0.625 MG tablet Take 0.625 mg by mouth daily.    Yes [provider]  naproxen sodium (ALEVE) 220 MG tablet Take 220-440 mg by mouth 2 (two) times daily as needed (for headaches or pain).   Yes [provider]     Vital Signs: BP (!) 143/61   Pulse 78   Temp 98.7 F (37.1 C) (Oral)   Resp 15   Ht 5\' 4"  (1.626 m)   SpO2 97%   Physical Exam Vitals and nursing note reviewed.  Constitutional:      General: She is not in acute distress. Pulmonary:     Effort: Pulmonary effort is normal. No respiratory distress.  Skin:    General: Skin is warm and dry.     Comments: Right femoral puncture site soft without active bleeding or hematoma.  Neurological:     Mental Status: She is alert.     Comments: Alert, awake, and oriented x3. Speech and comprehension intact. PERRL bilaterally. EOMs intact bilaterally without  nystagmus or subjective diplopia. ?Partial right side visual field deficit.  No facial asymmetry. Tongue midline. Can spontaneously move all extremities with mild right-sided weakness. No pronator drift. Fine motor and coordination intact and symmetric. Distal pulses (DPs) 1+ bilaterally.     Imaging: CT ABDOMEN PELVIS WO CONTRAST  Result Date: 02/18/2020 CLINICAL DATA:  Acute abdominal pain following neuro interventional procedure. Evaluate for retroperitoneal hemorrhage. EXAM: CT ABDOMEN AND PELVIS WITHOUT CONTRAST TECHNIQUE: Multidetector CT imaging of the abdomen and pelvis was performed following the standard protocol without IV contrast. COMPARISON:  None. FINDINGS: Lower chest: Mild dependent atelectasis at both lung bases. No basilar pneumothorax or significant pleural or pericardial effusion. Hepatobiliary: No focal hepatic abnormalities are identified. There is mild extrahepatic biliary dilatation post cholecystectomy, likely physiologic. Pancreas: Unremarkable. No pancreatic ductal dilatation or surrounding inflammatory changes. Spleen: Normal in size without focal abnormality. Adrenals/Urinary Tract: Small low-density adrenal nodules bilaterally consistent with adenomas. The right kidney is surgically absent. There is no mass in the nephrectomy bed. There are small cysts in the interpolar region of the left kidney. No hydronephrosis. The bladder is moderately distended with contrast. Stomach/Bowel: No evidence of bowel wall thickening, distention or surrounding inflammatory change. There are postsurgical changes in the stomach consistent with previous gastrojejunostomy. There is a small hiatal hernia. Vascular/Lymphatic: There are no enlarged abdominal or pelvic lymph nodes. Minimal aortic and branch vessel atherosclerosis. No evidence of retroperitoneal hematoma. There is minimal soft tissue stranding in the right groin. Reproductive: Hysterectomy.  No  adnexal mass. Other: No  hemoperitoneum, ascites or free intraperitoneal air. Postsurgical changes in the anterior abdominal wall without significant recurrent hernia. Musculoskeletal: No acute or significant osseous findings. IMPRESSION: 1. No acute findings or explanation for the patient's symptoms. No evidence of retroperitoneal hematoma. Minimal soft tissue stranding in the right groin, nonspecific. 2. Postsurgical changes as described. 3. Aortic Atherosclerosis (ICD10-I70.0). Electronically Signed   By: Carey Bullocks M.D.   On: 02/18/2020 16:21   MR BRAIN WO CONTRAST  Result Date: 02/19/2020 CLINICAL DATA:  Right lower extremity weakness.  Headache. EXAM: MRI HEAD WITHOUT CONTRAST TECHNIQUE: Multiplanar, multiecho pulse sequences of the brain and surrounding structures were obtained without intravenous contrast. COMPARISON:  None. FINDINGS: Brain: There is abnormal diffusion restriction within the medial left temporal lobe. Punctate focus of diffusion restriction at the superior aspect of the left thalamus. Old bilateral cerebellar infarcts. Multifocal hyperintense T2-weighted signal within the white matter. Normal volume of CSF spaces. No chronic microhemorrhage. Normal midline structures. Vascular: Normal flow voids. Skull and upper cervical spine: Normal marrow signal. Sinuses/Orbits: Negative. Other: None. IMPRESSION: 1. Multifocal acute ischemia within the left PCA territory, predominantly in the medial left temporal lobe. No hemorrhage or mass effect. 2. 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 Electronically Signed   By: Deatra Robinson M.D.   On: 02/19/2020 01:49   CT CEREBRAL PERFUSION W CONTRAST  Result Date: 02/18/2020 CLINICAL DATA:  Neuro deficit EXAM: CT PERFUSION BRAIN TECHNIQUE: Multiphase CT imaging of the brain was performed following IV bolus contrast injection. Subsequent parametric perfusion maps were calculated using RAPID software. CONTRAST:  44mL OMNIPAQUE IOHEXOL  350 MG/ML SOLN COMPARISON:  Prior same day noncontrast head CT. FINDINGS: CT Brain Perfusion Findings: CBF (<30%) Volume: 65mL Perfusion (Tmax>6.0s) volume: 11mL Mismatch Volume: 55mL ASPECTS on noncontrast CT Head: 9 at 12:32 p.m. today. Infarct Core: 0 mL Infarction Location:No infarct core demonstrated on perfusion imaging. IMPRESSION: Ischemia involving the left parietal and occipitotemporal regions. No core identified on perfusion imaging. Electronically Signed   By: Stana Bunting M.D.   On: 02/18/2020 12:51   DG CHEST PORT 1 VIEW  Result Date: 02/18/2020 CLINICAL DATA:  Right-sided arm weakness, right-sided facial droop, CVA EXAM: PORTABLE CHEST 1 VIEW COMPARISON:  None. FINDINGS: The heart size and mediastinal contours are within normal limits. Both lungs are clear. The visualized skeletal structures are unremarkable. IMPRESSION: No active disease. Electronically Signed   By: Sharlet Salina M.D.   On: 02/18/2020 19:28   CT HEAD CODE STROKE WO CONTRAST  Result Date: 02/18/2020 CLINICAL DATA:  Code stroke.  Neuro deficit EXAM: CT HEAD WITHOUT CONTRAST TECHNIQUE: Contiguous axial images were obtained from the base of the skull through the vertex without intravenous contrast. COMPARISON:  None. FINDINGS: Brain: No intracranial hemorrhage. Hypodense focus extending into the cortex within the left frontoparietal region is concerning for subacute insult (5:47). No mass lesion. No midline shift, ventriculomegaly or extra-axial fluid collection. Vascular: No hyperdense vessel or unexpected calcification. Skull: Negative for fracture or focal lesion. Sinuses/Orbits: No acute finding. Other: None. ASPECTS [facility] Stroke Program Early CT Score) - Ganglionic level infarction (caudate, lentiform nuclei, internal capsule, insula, M1-M3 cortex): 6 - Supraganglionic infarction (M4-M6 cortex): 3 Total score (0-10 with 10 being normal): 9 IMPRESSION: 1. 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 2. ASPECTS is 9  Code stroke imaging results were communicated on 02/18/2020 at 12:23 pm to provider Dr. Ezzie Dural Via telephone, who verbally acknowledged these results. Electronically Signed   By: Allegra Lai  Emekauwa M.D.   On: 02/18/2020 12:32   CT ANGIO HEAD CODE STROKE  Result Date: 02/18/2020 CLINICAL DATA:  Neuro deficit EXAM: CT ANGIOGRAPHY HEAD AND NECK TECHNIQUE: Multidetector CT imaging of the head and neck was performed using the standard protocol during bolus administration of intravenous contrast. Multiplanar CT image reconstructions and MIPs were obtained to evaluate the vascular anatomy. Carotid stenosis measurements (when applicable) are obtained utilizing NASCET criteria, using the distal internal carotid diameter as the denominator. CONTRAST:  75mL OMNIPAQUE IOHEXOL 350 MG/ML SOLN COMPARISON:  Concurrent noncontrast head CT and CT cerebral perfusion. FINDINGS: CTA NECK FINDINGS Aortic arch: Standard branching. Imaged portion shows no evidence of aneurysm or dissection. No significant stenosis of the major arch vessel origins. Right carotid system: No evidence of dissection, stenosis (50% or greater) or occlusion. Retropharyngeal course of the common and internal carotid arteries. Left carotid system: No evidence of dissection, stenosis (50% or greater) or occlusion. Retropharyngeal common carotid artery. Mild bifurcation atherosclerotic calcifications without significant luminal narrowing. Small web at the level of the bifurcation (6:27). Vertebral arteries: Codominant and diminutive. No evidence of dissection, stenosis (50% or greater) or occlusion. Skeleton: Multilevel spondylosis.  No acute osseous abnormality. Other neck: 2.4 x 1.7 cm right supraclavicular lesion, nonenhancing on arterial phase imaging (4:62). Bilateral thyroid hypoenhancing foci measuring up to 0.5 cm. No adenopathy by size criteria. Upper chest: Atelectasis. Review of the MIP images confirms the above findings CTA HEAD FINDINGS Anterior  circulation: Normal caliber patent appearance of the bilateral internal, anterior and middle cerebral arteries. The left PCOM may be hypoplastic or absent. No significant stenosis, proximal occlusion, aneurysm, or vascular malformation. Posterior circulation: Diminutive, codominant vertebral arteries. Persistent left hypoglossal artery. Normal caliber patent basilar artery. Dominant right AICA. Proximally patent bilateral superior cerebellar arteries. Patent normal caliber right posterior cerebral artery. There is occlusion of the distal left P2 segment (3:119) with non opacification distally. Venous sinuses: As permitted by contrast timing, patent. Anatomic variants: Persistent left hypoglossal artery (4:201). Review of the MIP images confirms the above findings IMPRESSION: Distal left P2 segment occlusion. No large vessel occlusion or high-grade narrowing involving the anterior circulation. Persistent left hypoglossal artery terminating as the basilar. Diminutive vertebral arteries. Small left carotid web at the level of the bifurcation. 2.4 cm right supraclavicular lesion is indeterminate. Outpatient workup with CT neck soft tissue with contrast is recommended. Code stroke imaging results were communicated on 02/18/2020 at 12:53 pm to provider Dr. Ezzie DuralSal Via telephone, who verbally acknowledged these results. Electronically Signed   By: Stana Buntinghikanele  Emekauwa M.D.   On: 02/18/2020 13:21   CT ANGIO NECK CODE STROKE  Result Date: 02/18/2020 CLINICAL DATA:  Neuro deficit EXAM: CT ANGIOGRAPHY HEAD AND NECK TECHNIQUE: Multidetector CT imaging of the head and neck was performed using the standard protocol during bolus administration of intravenous contrast. Multiplanar CT image reconstructions and MIPs were obtained to evaluate the vascular anatomy. Carotid stenosis measurements (when applicable) are obtained utilizing NASCET criteria, using the distal internal carotid diameter as the denominator. CONTRAST:  75mL  OMNIPAQUE IOHEXOL 350 MG/ML SOLN COMPARISON:  Concurrent noncontrast head CT and CT cerebral perfusion. FINDINGS: CTA NECK FINDINGS Aortic arch: Standard branching. Imaged portion shows no evidence of aneurysm or dissection. No significant stenosis of the major arch vessel origins. Right carotid system: No evidence of dissection, stenosis (50% or greater) or occlusion. Retropharyngeal course of the common and internal carotid arteries. Left carotid system: No evidence of dissection, stenosis (50% or greater) or occlusion. Retropharyngeal common  carotid artery. Mild bifurcation atherosclerotic calcifications without significant luminal narrowing. Small web at the level of the bifurcation (6:27). Vertebral arteries: Codominant and diminutive. No evidence of dissection, stenosis (50% or greater) or occlusion. Skeleton: Multilevel spondylosis.  No acute osseous abnormality. Other neck: 2.4 x 1.7 cm right supraclavicular lesion, nonenhancing on arterial phase imaging (4:62). Bilateral thyroid hypoenhancing foci measuring up to 0.5 cm. No adenopathy by size criteria. Upper chest: Atelectasis. Review of the MIP images confirms the above findings CTA HEAD FINDINGS Anterior circulation: Normal caliber patent appearance of the bilateral internal, anterior and middle cerebral arteries. The left PCOM may be hypoplastic or absent. No significant stenosis, proximal occlusion, aneurysm, or vascular malformation. Posterior circulation: Diminutive, codominant vertebral arteries. Persistent left hypoglossal artery. Normal caliber patent basilar artery. Dominant right AICA. Proximally patent bilateral superior cerebellar arteries. Patent normal caliber right posterior cerebral artery. There is occlusion of the distal left P2 segment (3:119) with non opacification distally. Venous sinuses: As permitted by contrast timing, patent. Anatomic variants: Persistent left hypoglossal artery (4:201). Review of the MIP images confirms the above  findings IMPRESSION: Distal left P2 segment occlusion. No large vessel occlusion or high-grade narrowing involving the anterior circulation. Persistent left hypoglossal artery terminating as the basilar. Diminutive vertebral arteries. Small left carotid web at the level of the bifurcation. 2.4 cm right supraclavicular lesion is indeterminate. Outpatient workup with CT neck soft tissue with contrast is recommended. Code stroke imaging results were communicated on 02/18/2020 at 12:53 pm to provider Dr. Ezzie Dural Via telephone, who verbally acknowledged these results. Electronically Signed   By: Stana Bunting M.D.   On: 02/18/2020 13:21    Labs:  CBC: Recent Labs    02/18/20 1212 02/18/20 1217 02/19/20 0816  WBC 8.1  --  8.6  HGB 15.0 15.3* 13.0  HCT 46.3* 45.0 40.8  PLT 327  --  292    COAGS: Recent Labs    02/18/20 1212  INR 0.9  APTT 25    BMP: Recent Labs    02/18/20 1212 02/18/20 1217  NA 138 138  K 4.8 4.8  CL 105 107  CO2 24  --   GLUCOSE 101* 96  BUN 15 18  CALCIUM 9.2  --   CREATININE 0.93 0.80  GFRNONAA >60  --   GFRAA >60  --     LIVER FUNCTION TESTS: Recent Labs    02/18/20 1212  BILITOT 0.5  AST 20  ALT 12  ALKPHOS 79  PROT 6.6  ALBUMIN 3.2*    Assessment and Plan:  History of acute CVA s/p cerebral arteriogram with 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 Corliss Skains. Patient's condition improving- can spontaneously move all extremities with mild right-sided weakness, ?partial right side visual field deficit. Right femoral puncture site stable, distal pulses (DPs) 1+ bilaterally. Further plans per neurology- appreciate and agree with management. Please call NIR with questions/concerns.   Electronically Signed: Elwin Mocha, PA-C 02/19/2020, 9:14 AM   I spent a total of 25 Minutes at the the patient's bedside AND on the patient's  hospital floor or unit, greater than 50% of which was counseling/coordinating care for CVA s/p revascularization.

## 2020-02-19 NOTE — Progress Notes (Signed)
Patient arrived in the unit at 2140 pm from 4N, A&O, no s/s distress, initiated Telemery  Monitor, and will continue to monitor.

## 2020-02-19 NOTE — Progress Notes (Signed)
  Echocardiogram 2D Echocardiogram has been performed.  Maria Green 02/19/2020, 3:15 PM

## 2020-02-19 NOTE — Evaluation (Signed)
Clinical/Bedside Swallow Evaluation Patient Details  Name: Maria Green MRN: [id number] Date of Birth: [birth year]  Today's Date: 02/19/2020 Time: SLP Start Time (ACUTE ONLY): 1135 SLP Stop Time (ACUTE ONLY): 1152 SLP Time Calculation (min) (ACUTE ONLY): 17 min  Past Medical History: History reviewed. No pertinent past medical history. Past Surgical History:  Past Surgical History:  Procedure Laterality Date   RADIOLOGY WITH ANESTHESIA N/A 02/18/2020   Procedure: IR WITH ANESTHESIA;  Surgeon: Radiologist, Medication, MD;  Location: [facility] OR;  Service: Radiology;  Laterality: N/A;   HPI:  Pt is a 69 y.o. female with no known PMH. She presented to [facility] 02/18/20 with severe headache and R facial droop. Pt found to have L PCA ischemic CVA and occlusion. She underwent cerebral angiogram with revascularization of L PCA on 8/31 (ETT for procedure only). MRI 02/19/20 revealed multifocal acute ischemia within left PCA, predominantly in medial L temporal lobe.   Assessment / Plan / Recommendation Clinical Impression   Pt presents with oropharyngeal swallow function WFL. She consumed single and serial sips of thin H2O via cup and straw, as well as self-fed trials of puree and regular solid textures without overt s/sx aspiration. Oral transit was functional and timely without any residual PO residue noted. Pt did report sensation concerning for globus sensation. When following bites of solids with liquid, pt denied any further feelings of it "getting stuck". She used swallow precautions without need for any further cueing required. Would recommend pt initiate a regular texture diet, thin liquid, medications may be given whole with liquid. ST will continue to follow to ensure diet safety, efficiency, and education as needed while inpatient, as well as to administer formal cognitive-linguistic evaluation.   SLP Visit Diagnosis: Dysphagia, unspecified (R13.10)    Aspiration Risk  Mild aspiration risk    Diet  Recommendation Thin liquid;Regular   Liquid Administration via: Cup;Straw Medication Administration: Whole meds with liquid Supervision: Patient able to self feed Compensations: Slow rate;Small sips/bites;Follow solids with liquid Postural Changes: Seated upright at 90 degrees;Remain upright for at least 30 minutes after po intake    Other  Recommendations Oral Care Recommendations: Oral care BID   Follow up Recommendations 24 hour supervision/assistance;Other (comment) (Follow up therapy TBD)      Frequency and Duration min 2x/week  2 weeks       Prognosis Prognosis for Safe Diet Advancement: Good      Swallow Study   General Date of Onset: 02/18/20 HPI: Pt is a 69 y.o. female with no known PMH. She presented to [facility] 02/18/20 with severe headache and R facial droop. Pt found to have L PCA ischemic CVA and occlusion. She underwent cerebral angiogram with revascularization of L PCA on 8/31 (ETT for procedure only). MRI 02/19/20 revealed multifocal acute ischemia within left PCA, predominantly in medial L temporal lobe. Type of Study: Bedside Swallow Evaluation Previous Swallow Assessment: none found in chart Diet Prior to this Study: NPO Temperature Spikes Noted: No Respiratory Status: Room air History of Recent Intubation:  (for procedure only) Behavior/Cognition: Cooperative;Pleasant mood;Alert Oral Cavity Assessment: Within Functional Limits Oral Care Completed by SLP: No Oral Cavity - Dentition: Other (Comment) (implants) Vision: Functional for self-feeding Self-Feeding Abilities: Able to feed self Patient Positioning: Upright in chair Baseline Vocal Quality: Normal Volitional Cough: Strong Volitional Swallow: Able to elicit    Oral/Motor/Sensory Function Overall Oral Motor/Sensory Function: Within functional limits   Ice Chips Ice chips: Not tested   Thin Liquid Thin Liquid: Within functional limits Presentation: Self Fed;Cup;Straw  Nectar Thick Nectar Thick Liquid:  Not tested   Honey Thick Honey Thick Liquid: Not tested   Puree Puree: Within functional limits Presentation: Self Fed;Spoon   Solid     Solid: Within functional limits Presentation: Self Fed;Spoon      Little Ishikawa 02/19/2020,1:12 PM

## 2020-02-19 NOTE — Evaluation (Signed)
Physical Therapy Evaluation Patient Details Name: Maria Green MRN: [id number] DOB: 08/02/1950 Today's Date: 02/19/2020   History of Present Illness  69 y.o. female with no past medical history known.  Patient states that she was fine when she went to bed at approximately 12:00 last night.  When she woke up at 9 AM she noted that she was having severe difficulty walking as her right leg was weak.  She also tried to make some coffee with her right hand and dropped her coffee.  Upon EMS arrival she was complaining of a severe headache.  And had a slight right facial droop. 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  Clinical Impression  Pt presents to PT with deficits in functional mobility, gait, balance, endurance, strength, power, and with some back pain. Pt is generally weak, with more pronounced weakness on R side. Pt reports reduced sensation to R side and demonstrates some R foot drag during ambulation. Pt requires close guard for all mobility to maintain safety this session. Pt will benefit from continued acute PT POC to improve strength and gait quality in order to restore independence in mobility. PT recommends discharge home with HHPT and use of her walking stick, as well as a 3in1 commode and assistance/supervision from family for out of bed mobility.    Follow Up Recommendations Home health PT;Supervision for mobility/OOB    Equipment Recommendations  3in1 (PT)    Recommendations for Other Services       Precautions / Restrictions Precautions Precautions: Fall Restrictions Weight Bearing Restrictions: No      Mobility  Bed Mobility Overal bed mobility: Needs Assistance Bed Mobility: Sidelying to Sit   Sidelying to sit: Min assist       General bed mobility comments: very minimal assistance to push up into sitting  Transfers Overall transfer level: Needs  assistance Equipment used: None Transfers: Sit to/from [employer] Transfers Sit to Stand: Min guard Stand pivot transfers: Min guard          Ambulation/Gait Ambulation/Gait assistance: Supervision Gait Distance (Feet): 15 Feet Assistive device: None Gait Pattern/deviations: Step-to pattern;Decreased step length - right;Decreased dorsiflexion - right Gait velocity: reduced Gait velocity interpretation: <1.8 ft/sec, indicate of risk for recurrent falls General Gait Details: pt with short step to gait pattern, reduced foot clearance of RLE, slowed gait speed  Stairs            Wheelchair Mobility    Modified Rankin (Stroke Patients Only) Modified Rankin (Stroke Patients Only) Pre-Morbid Rankin Score: No symptoms Modified Rankin: Moderately severe disability     Balance                                             Pertinent Vitals/Pain Pain Assessment: Faces Faces Pain Scale: Hurts even more Pain Location: back Pain Descriptors / Indicators: Aching Pain Intervention(s): Monitored during session    Home Living Family/patient expects to be discharged to:: Private residence Living Arrangements: Alone Available Help at Discharge: Family;Available 24 hours/day Type of Home: House Home Access: Stairs to enter Entrance Stairs-Rails: None Entrance Stairs-Number of Steps: 4 Home Layout: One level Home Equipment:  (walking stick)      Prior Function Level of Independence: Independent         Comments: pt watches  her 2 yougest grandchildren PRN     Hand Dominance        Extremity/Trunk Assessment   Upper Extremity Assessment Upper Extremity Assessment: RUE deficits/detail;Generalized weakness RUE Deficits / Details: RUE grossly 4/5, posturing into slight elbow flexion and internal rotation at rest RUE Sensation: decreased light touch    Lower Extremity Assessment Lower Extremity Assessment: Generalized weakness;RLE  deficits/detail RLE Deficits / Details: RLE grossly 4/5 RLE Sensation: decreased light touch    Cervical / Trunk Assessment Cervical / Trunk Assessment: Normal  Communication   Communication: No difficulties  Cognition Arousal/Alertness: Awake/alert Behavior During Therapy: WFL for tasks assessed/performed Overall Cognitive Status: Within Functional Limits for tasks assessed                                 General Comments: pt is unable to identify the name of hospital as she is new to the area (moved to [place] 2 years ago). Does state she is likely near [place].      General Comments General comments (skin integrity, edema, etc.): pt on 2L [place] initially, weaned to RA, VSS on RA    Exercises     Assessment/Plan    PT Assessment Patient needs continued PT services  PT Problem List Decreased strength;Decreased activity tolerance;Decreased balance;Decreased mobility;Decreased knowledge of use of DME;Pain;Impaired sensation       PT Treatment Interventions DME instruction;Gait training;Stair training;Functional mobility training;Therapeutic activities;Therapeutic exercise;Balance training;Neuromuscular re-education;Patient/family education    PT Goals (Current goals can be found in the Care Plan section)  Acute Rehab PT Goals Patient Stated Goal: To return to independence PT Goal Formulation: With patient Time For Goal Achievement: 03/04/20 Potential to Achieve Goals: Good    Frequency Min 4X/week   Barriers to discharge        Co-evaluation               AM-PAC PT "6 Clicks" Mobility  Outcome Measure Help needed turning from your back to your side while in a flat bed without using bedrails?: None Help needed moving from lying on your back to sitting on the side of a flat bed without using bedrails?: A Little Help needed moving to and from a bed to a chair (including a wheelchair)?: A Little Help needed standing up from a chair using your arms  (e.g., wheelchair or bedside chair)?: A Little Help needed to walk in hospital room?: A Little Help needed climbing 3-5 steps with a railing? : A Lot 6 Click Score: 18    End of Session Equipment Utilized During Treatment: Oxygen Activity Tolerance: Patient tolerated treatment well (slightly limited by back pain) Patient left: Other (comment) (on commode, OT present at end of session) Nurse Communication: Mobility status PT Visit Diagnosis: Unsteadiness on feet (R26.81);Other abnormalities of gait and mobility (R26.89);Muscle weakness (generalized) (M62.81);Other symptoms and signs involving the nervous system (R29.898);Pain Pain - part of body:  (back)    Time: 2122-4825 PT Time Calculation (min) (ACUTE ONLY): 24 min   Charges:   PT Evaluation $PT Eval Moderate Complexity: 1 Mod PT Treatments $Therapeutic Activity: 8-22 mins        Arlyss Gandy, PT, DPT Acute Rehabilitation Pager: [phone]   Arlyss Gandy 02/19/2020, 9:20 AM

## 2020-02-19 NOTE — Evaluation (Signed)
Occupational Therapy Evaluation Patient Details Name: Maria Green MRN: [id number] DOB: 1951/02/15 Today's Date: 02/19/2020    History of Present Illness 69 y.o. female with no past medical history known.  Patient states that she was fine when she went to bed at approximately 12:00 last night.  When she woke up at 9 AM she noted that she was having severe difficulty walking as her right leg was weak.  She also tried to make some coffee with her right hand and dropped her coffee.  Upon EMS arrival she was complaining of a severe headache.  And had a slight right facial droop. 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   Clinical Impression   PTA patient independent and driving. Admitted for above and limited by problem list below, including R sided weakness and impaired coordination, R visual field deficit, impaired balance and decreased activity tolerance. Patients cognition appears WFL, but noted decreased awareness to deficits, further assessment warranted.  Patient currently requires min guard for transfers and limited in room mobility, min-mod assist for ADLs.  She plans on dcing home to her daughters home and will have good support at dc.  Believe she will benefit from further OT services while admitted and after dc at [facility] level to optimize independence and safety with ADLs, mobility.     Follow Up Recommendations  Home health OT;Supervision/Assistance - 24 hour    Equipment Recommendations  3 in 1 bedside commode    Recommendations for Other Services       Precautions / Restrictions Precautions Precautions: Fall Restrictions Weight Bearing Restrictions: No      Mobility Bed Mobility Overal bed mobility: Needs Assistance Bed Mobility: Sidelying to Sit   Sidelying to sit: Min assist       General bed mobility comments: OOB upon entry   Transfers Overall transfer level:  Needs assistance Equipment used: None Transfers: Sit to/from Stand Sit to Stand: Min guard Stand pivot transfers: Min guard       General transfer comment: min guard for safety/balance, cueing ofr hand placement     Balance Overall balance assessment: Needs assistance Sitting-balance support: No upper extremity supported;Feet supported Sitting balance-Leahy Scale: Fair     Standing balance support: No upper extremity supported;Single extremity supported;During functional activity Standing balance-Leahy Scale: Fair Standing balance comment: preference to UE support, reaching for support dynamically                           ADL either performed or assessed with clinical judgement   ADL Overall ADL's : Needs assistance/impaired     Grooming: Standing;Minimal assistance   Upper Body Bathing: Minimal assistance;Sitting   Lower Body Bathing: Sit to/from stand;Minimal assistance   Upper Body Dressing : Minimal assistance;Sitting   Lower Body Dressing: Minimal assistance;Sit to/from stand   Toilet Transfer: Min guard;Ambulation;Grab bars   Toileting- Clothing Manipulation and Hygiene: Min guard;Sit to/from stand       Functional mobility during ADLs: Min guard General ADL Comments: pt limited by R field cut visually, R sided weakness     Vision Baseline Vision/History: Wears glasses Wears Glasses: At all times Patient Visual Report: No change from baseline Vision Assessment?: Yes Eye Alignment: Within Functional Limits Ocular Range of Motion: Within Functional Limits Alignment/Gaze Preference: Within Defined Limits Tracking/Visual Pursuits: Decreased smoothness of horizontal tracking Visual Fields: Right visual field deficit  Additional Comments: patient with R visual field loss, close to midline      Perception     Praxis      Pertinent Vitals/Pain Pain Assessment: Faces Faces Pain Scale: Hurts even more Pain Location: back Pain Descriptors /  Indicators: Aching Pain Intervention(s): Limited activity within patient's tolerance;Monitored during session;Repositioned     Hand Dominance Right   Extremity/Trunk Assessment Upper Extremity Assessment Upper Extremity Assessment: RUE deficits/detail RUE Deficits / Details: RUE grossly 3+/5, posturing into slight elbow flexion and internal rotation at rest RUE Sensation: WNL RUE Coordination: decreased fine motor;decreased gross motor   Lower Extremity Assessment Lower Extremity Assessment: Defer to PT evaluation RLE Deficits / Details: RLE grossly 4/5 RLE Sensation: decreased light touch   Cervical / Trunk Assessment Cervical / Trunk Assessment: Normal   Communication Communication Communication: No difficulties   Cognition Arousal/Alertness: Awake/alert Behavior During Therapy: WFL for tasks assessed/performed Overall Cognitive Status: Impaired/Different from baseline Area of Impairment: Awareness                           Awareness: Emergent   General Comments: appears WFL, decreased awareness of R visual field cut but improving during session   General Comments  VSS on RA, daughter present at completion of session     Exercises     Shoulder Instructions      Home Living Family/patient expects to be discharged to:: Private residence Living Arrangements: Alone Available Help at Discharge: Family;Available 24 hours/day Type of Home: House Home Access: Stairs to enter [employer] of Steps: 4 Entrance Stairs-Rails: None Home Layout: One level     Bathroom Shower/Tub: Chief Strategy Officer: Standard     Home Equipment:  (walking stick)   Additional Comments: pla to dc to daughters home, will have 24/7 support      Prior Functioning/Environment Level of Independence: Independent        Comments: pt watches her 2 yougest grandchildren PRN        OT Problem List: Decreased strength;Decreased activity  tolerance;Impaired balance (sitting and/or standing);Impaired vision/perception;Decreased coordination;Decreased safety awareness;Decreased cognition;Decreased knowledge of use of DME or AE;Decreased knowledge of precautions;Impaired UE functional use;Pain      OT Treatment/Interventions: Self-care/ADL training;DME and/or AE instruction;Energy conservation;Neuromuscular education;Therapeutic activities;Cognitive remediation/compensation;Patient/family education;Balance training;Visual/perceptual remediation/compensation    OT Goals(Current goals can be found in the care plan section) Acute Rehab OT Goals Patient Stated Goal: To return to independence OT Goal Formulation: With patient Time For Goal Achievement: 03/04/20 Potential to Achieve Goals: Good  OT Frequency: Min 2X/week   Barriers to D/C:            Co-evaluation              AM-PAC OT "6 Clicks" Daily Activity     Outcome Measure Help from another person eating meals?: A Little Help from another person taking care of personal grooming?: A Little Help from another person toileting, which includes using toliet, bedpan, or urinal?: A Little Help from another person bathing (including washing, rinsing, drying)?: A Little Help from another person to put on and taking off regular upper body clothing?: A Little Help from another person to put on and taking off regular lower body clothing?: A Lot 6 Click Score: 17   End of Session Nurse Communication: Mobility status  Activity Tolerance: Patient tolerated treatment well Patient left: in chair;with call bell/phone within reach;with family/visitor present  OT Visit Diagnosis: Other abnormalities of  gait and mobility (R26.89);Muscle weakness (generalized) (M62.81);Pain;Low vision, both eyes (H54.2);Hemiplegia and hemiparesis Hemiplegia - Right/Left: Right Hemiplegia - dominant/non-dominant: Dominant Hemiplegia - caused by: Cerebral infarction Pain - part of body:  (back)                 Time: 7169-6789 OT Time Calculation (min): 36 min Charges:  OT General Charges $OT Visit: 1 Visit OT Evaluation $OT Eval Moderate Complexity: 1 Mod  (MD in room during session, so only 1 Eval charged)   Barry Brunner, OT Acute Rehabilitation Services Pager [phone] Office [phone]   Chancy Milroy 02/19/2020, 10:10 AM

## 2020-02-19 NOTE — Progress Notes (Signed)
STROKE TEAM PROGRESS NOTE   INTERVAL HISTORY I have personally reviewed history of present illness with the patient, electronic medical records and imaging films in PACS.  Daughter also arrived during rounds.  She presented with right-sided weakness and facial droop upon awakening outside time window for TPA.  CT angiogram showed left PCA occlusion at P2/P3 junction and CT perfusion showed a favorable mismatch and patient went for emergent thrombectomy which was performed successfully.  She has remained stable post procedure overnight and has shown improvement in the right-sided strength.  Blood pressure adequately controlled.  MRI scan of the brain has been obtained which showed multifocal patchy acute left PCA infarct predominantly in the medial left temporal lobe without any hemorrhage or mass-effect.  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 Vitals:   02/19/20 0500 02/19/20 0530 02/19/20 0600 02/19/20 0630  BP: (!) 130/58 139/66 125/72 (!) 143/61  Pulse: 79 83 85 78  Resp: 15 15 18 15   Temp:      TempSrc:      SpO2: 97% 97% 95% 97%  Height:       CBC:  Recent Labs  Lab 02/18/20 1212 02/18/20 1212 02/18/20 1217 02/19/20 0816  WBC 8.1  --   --  8.6  NEUTROABS 4.9  --   --  5.9  HGB 15.0   < > 15.3* 13.0  HCT 46.3*   < > 45.0 40.8  MCV 97.9  --   --  97.1  PLT 327  --   --  292   < > = values in this interval not displayed.   Basic Metabolic Panel:  Recent Labs  Lab 02/18/20 1212 02/18/20 1217  NA 138 138  K 4.8 4.8  CL 105 107  CO2 24  --   GLUCOSE 101* 96  BUN 15 18  CREATININE 0.93 0.80  CALCIUM 9.2  --    Lipid Panel:  Recent Labs  Lab 02/19/20 0511  CHOL 186  TRIG 142  HDL 65  CHOLHDL 2.9  VLDL 28  LDLCALC 93   HgbA1c:  Recent Labs  Lab 02/19/20 0816  HGBA1C 5.3   Urine Drug Screen: No results for input(s): LABOPIA, COCAINSCRNUR, LABBENZ, AMPHETMU, THCU, LABBARB in the last 168 hours.  Alcohol Level No results for input(s): ETH in the  last 168 hours.  IMAGING past 24 hours CT ABDOMEN PELVIS WO CONTRAST  Result Date: 02/18/2020 CLINICAL DATA:  Acute abdominal pain following neuro interventional procedure. Evaluate for retroperitoneal hemorrhage. EXAM: CT ABDOMEN AND PELVIS WITHOUT CONTRAST TECHNIQUE: Multidetector CT imaging of the abdomen and pelvis was performed following the standard protocol without IV contrast. COMPARISON:  None. FINDINGS: Lower chest: Mild dependent atelectasis at both lung bases. No basilar pneumothorax or significant pleural or pericardial effusion. Hepatobiliary: No focal hepatic abnormalities are identified. There is mild extrahepatic biliary dilatation post cholecystectomy, likely physiologic. Pancreas: Unremarkable. No pancreatic ductal dilatation or surrounding inflammatory changes. Spleen: Normal in size without focal abnormality. Adrenals/Urinary Tract: Small low-density adrenal nodules bilaterally consistent with adenomas. The right kidney is surgically absent. There is no mass in the nephrectomy bed. There are small cysts in the interpolar region of the left kidney. No hydronephrosis. The bladder is moderately distended with contrast. Stomach/Bowel: No evidence of bowel wall thickening, distention or surrounding inflammatory change. There are postsurgical changes in the stomach consistent with previous gastrojejunostomy. There is a small hiatal hernia. Vascular/Lymphatic: There are no enlarged abdominal or pelvic lymph nodes. Minimal aortic and branch vessel  atherosclerosis. No evidence of retroperitoneal hematoma. There is minimal soft tissue stranding in the right groin. Reproductive: Hysterectomy.  No adnexal mass. Other: No hemoperitoneum, ascites or free intraperitoneal air. Postsurgical changes in the anterior abdominal wall without significant recurrent hernia. Musculoskeletal: No acute or significant osseous findings. IMPRESSION: 1. No acute findings or explanation for the patient's symptoms. No  evidence of retroperitoneal hematoma. Minimal soft tissue stranding in the right groin, nonspecific. 2. Postsurgical changes as described. 3. Aortic Atherosclerosis (ICD10-I70.0). Electronically Signed   By: Carey Bullocks M.D.   On: 02/18/2020 16:21   MR BRAIN WO CONTRAST  Result Date: 02/19/2020 CLINICAL DATA:  Right lower extremity weakness.  Headache. EXAM: MRI HEAD WITHOUT CONTRAST TECHNIQUE: Multiplanar, multiecho pulse sequences of the brain and surrounding structures were obtained without intravenous contrast. COMPARISON:  None. FINDINGS: Brain: There is abnormal diffusion restriction within the medial left temporal lobe. Punctate focus of diffusion restriction at the superior aspect of the left thalamus. Old bilateral cerebellar infarcts. Multifocal hyperintense T2-weighted signal within the white matter. Normal volume of CSF spaces. No chronic microhemorrhage. Normal midline structures. Vascular: Normal flow voids. Skull and upper cervical spine: Normal marrow signal. Sinuses/Orbits: Negative. Other: None. IMPRESSION: 1. Multifocal acute ischemia within the left PCA territory, predominantly in the medial left temporal lobe. No hemorrhage or mass effect. 2. 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 Electronically Signed   By: Deatra Robinson M.D.   On: 02/19/2020 01:49   CT CEREBRAL PERFUSION W CONTRAST  Result Date: 02/18/2020 CLINICAL DATA:  Neuro deficit EXAM: CT PERFUSION BRAIN TECHNIQUE: Multiphase CT imaging of the brain was performed following IV bolus contrast injection. Subsequent parametric perfusion maps were calculated using RAPID software. CONTRAST:  48mL OMNIPAQUE IOHEXOL 350 MG/ML SOLN COMPARISON:  Prior same day noncontrast head CT. FINDINGS: CT Brain Perfusion Findings: CBF (<30%) Volume: 35mL Perfusion (Tmax>6.0s) volume: 17mL Mismatch Volume: 20mL ASPECTS on noncontrast CT Head: 9 at 12:32 p.m. today. Infarct Core: 0 mL Infarction Location:No  infarct core demonstrated on perfusion imaging. IMPRESSION: Ischemia involving the left parietal and occipitotemporal regions. No core identified on perfusion imaging. Electronically Signed   By: Stana Bunting M.D.   On: 02/18/2020 12:51   DG CHEST PORT 1 VIEW  Result Date: 02/18/2020 CLINICAL DATA:  Right-sided arm weakness, right-sided facial droop, CVA EXAM: PORTABLE CHEST 1 VIEW COMPARISON:  None. FINDINGS: The heart size and mediastinal contours are within normal limits. Both lungs are clear. The visualized skeletal structures are unremarkable. IMPRESSION: No active disease. Electronically Signed   By: Sharlet Salina M.D.   On: 02/18/2020 19:28   CT HEAD CODE STROKE WO CONTRAST  Result Date: 02/18/2020 CLINICAL DATA:  Code stroke.  Neuro deficit EXAM: CT HEAD WITHOUT CONTRAST TECHNIQUE: Contiguous axial images were obtained from the base of the skull through the vertex without intravenous contrast. COMPARISON:  None. FINDINGS: Brain: No intracranial hemorrhage. Hypodense focus extending into the cortex within the left frontoparietal region is concerning for subacute insult (5:47). No mass lesion. No midline shift, ventriculomegaly or extra-axial fluid collection. Vascular: No hyperdense vessel or unexpected calcification. Skull: Negative for fracture or focal lesion. Sinuses/Orbits: No acute finding. Other: None. ASPECTS [facility] Stroke Program Early CT Score) - Ganglionic level infarction (caudate, lentiform nuclei, internal capsule, insula, M1-M3 cortex): 6 - Supraganglionic infarction (M4-M6 cortex): 3 Total score (0-10 with 10 being normal): 9 IMPRESSION: 1. 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 2. ASPECTS is 9 Code stroke imaging results were communicated on 02/18/2020 at  12:23 pm to provider Dr. Ezzie Dural Via telephone, who verbally acknowledged these results. Electronically Signed   By: Stana Bunting M.D.   On: 02/18/2020 12:32   CT ANGIO HEAD CODE STROKE  Result Date:  02/18/2020 CLINICAL DATA:  Neuro deficit EXAM: CT ANGIOGRAPHY HEAD AND NECK TECHNIQUE: Multidetector CT imaging of the head and neck was performed using the standard protocol during bolus administration of intravenous contrast. Multiplanar CT image reconstructions and MIPs were obtained to evaluate the vascular anatomy. Carotid stenosis measurements (when applicable) are obtained utilizing NASCET criteria, using the distal internal carotid diameter as the denominator. CONTRAST:  16mL OMNIPAQUE IOHEXOL 350 MG/ML SOLN COMPARISON:  Concurrent noncontrast head CT and CT cerebral perfusion. FINDINGS: CTA NECK FINDINGS Aortic arch: Standard branching. Imaged portion shows no evidence of aneurysm or dissection. No significant stenosis of the major arch vessel origins. Right carotid system: No evidence of dissection, stenosis (50% or greater) or occlusion. Retropharyngeal course of the common and internal carotid arteries. Left carotid system: No evidence of dissection, stenosis (50% or greater) or occlusion. Retropharyngeal common carotid artery. Mild bifurcation atherosclerotic calcifications without significant luminal narrowing. Small web at the level of the bifurcation (6:27). Vertebral arteries: Codominant and diminutive. No evidence of dissection, stenosis (50% or greater) or occlusion. Skeleton: Multilevel spondylosis.  No acute osseous abnormality. Other neck: 2.4 x 1.7 cm right supraclavicular lesion, nonenhancing on arterial phase imaging (4:62). Bilateral thyroid hypoenhancing foci measuring up to 0.5 cm. No adenopathy by size criteria. Upper chest: Atelectasis. Review of the MIP images confirms the above findings CTA HEAD FINDINGS Anterior circulation: Normal caliber patent appearance of the bilateral internal, anterior and middle cerebral arteries. The left PCOM may be hypoplastic or absent. No significant stenosis, proximal occlusion, aneurysm, or vascular malformation. Posterior circulation: Diminutive,  codominant vertebral arteries. Persistent left hypoglossal artery. Normal caliber patent basilar artery. Dominant right AICA. Proximally patent bilateral superior cerebellar arteries. Patent normal caliber right posterior cerebral artery. There is occlusion of the distal left P2 segment (3:119) with non opacification distally. Venous sinuses: As permitted by contrast timing, patent. Anatomic variants: Persistent left hypoglossal artery (4:201). Review of the MIP images confirms the above findings IMPRESSION: Distal left P2 segment occlusion. No large vessel occlusion or high-grade narrowing involving the anterior circulation. Persistent left hypoglossal artery terminating as the basilar. Diminutive vertebral arteries. Small left carotid web at the level of the bifurcation. 2.4 cm right supraclavicular lesion is indeterminate. Outpatient workup with CT neck soft tissue with contrast is recommended. Code stroke imaging results were communicated on 02/18/2020 at 12:53 pm to provider Dr. Ezzie Dural Via telephone, who verbally acknowledged these results. Electronically Signed   By: Stana Bunting M.D.   On: 02/18/2020 13:21   CT ANGIO NECK CODE STROKE  Result Date: 02/18/2020 CLINICAL DATA:  Neuro deficit EXAM: CT ANGIOGRAPHY HEAD AND NECK TECHNIQUE: Multidetector CT imaging of the head and neck was performed using the standard protocol during bolus administration of intravenous contrast. Multiplanar CT image reconstructions and MIPs were obtained to evaluate the vascular anatomy. Carotid stenosis measurements (when applicable) are obtained utilizing NASCET criteria, using the distal internal carotid diameter as the denominator. CONTRAST:  44mL OMNIPAQUE IOHEXOL 350 MG/ML SOLN COMPARISON:  Concurrent noncontrast head CT and CT cerebral perfusion. FINDINGS: CTA NECK FINDINGS Aortic arch: Standard branching. Imaged portion shows no evidence of aneurysm or dissection. No significant stenosis of the major arch vessel  origins. Right carotid system: No evidence of dissection, stenosis (50% or greater) or occlusion. Retropharyngeal course of  the common and internal carotid arteries. Left carotid system: No evidence of dissection, stenosis (50% or greater) or occlusion. Retropharyngeal common carotid artery. Mild bifurcation atherosclerotic calcifications without significant luminal narrowing. Small web at the level of the bifurcation (6:27). Vertebral arteries: Codominant and diminutive. No evidence of dissection, stenosis (50% or greater) or occlusion. Skeleton: Multilevel spondylosis.  No acute osseous abnormality. Other neck: 2.4 x 1.7 cm right supraclavicular lesion, nonenhancing on arterial phase imaging (4:62). Bilateral thyroid hypoenhancing foci measuring up to 0.5 cm. No adenopathy by size criteria. Upper chest: Atelectasis. Review of the MIP images confirms the above findings CTA HEAD FINDINGS Anterior circulation: Normal caliber patent appearance of the bilateral internal, anterior and middle cerebral arteries. The left PCOM may be hypoplastic or absent. No significant stenosis, proximal occlusion, aneurysm, or vascular malformation. Posterior circulation: Diminutive, codominant vertebral arteries. Persistent left hypoglossal artery. Normal caliber patent basilar artery. Dominant right AICA. Proximally patent bilateral superior cerebellar arteries. Patent normal caliber right posterior cerebral artery. There is occlusion of the distal left P2 segment (3:119) with non opacification distally. Venous sinuses: As permitted by contrast timing, patent. Anatomic variants: Persistent left hypoglossal artery (4:201). Review of the MIP images confirms the above findings IMPRESSION: Distal left P2 segment occlusion. No large vessel occlusion or high-grade narrowing involving the anterior circulation. Persistent left hypoglossal artery terminating as the basilar. Diminutive vertebral arteries. Small left carotid web at the level of  the bifurcation. 2.4 cm right supraclavicular lesion is indeterminate. Outpatient workup with CT neck soft tissue with contrast is recommended. Code stroke imaging results were communicated on 02/18/2020 at 12:53 pm to provider Dr. Ezzie Dural Via telephone, who verbally acknowledged these results. Electronically Signed   By: Stana Bunting M.D.   On: 02/18/2020 13:21    PHYSICAL EXAM Pleasant elderly lady not in distress. . Afebrile. Head is nontraumatic. Neck is supple without bruit.    Cardiac exam no murmur or gallop. Lungs are clear to auscultation. Distal pulses are well felt. Neurological Exam ;  Awake  Alert oriented x 3. Normal speech and language.eye movements full without nystagmus.fundi were not visualized. Vision acuity   appears normal.  Right partial homonymous hemianopsia.  Hearing is normal. Palatal movements are normal. Face symmetric. Tongue midline. Normal strength, tone, reflexes and coordination except slight right upper and lower extremity drift and diminished fine finger movements on the right.  Orbits left over right upper extremity.  Mild weakness of right hip flexors and ankle dorsiflexors only.. Normal sensation. Gait deferred.  NIH stroke scale 3.  Premorbid modified Rankin scale at baseline 0 ASSESSMENT/PLAN Maria Green is a 69 y.o. female with no known past medical history  presenting with R sided weakness, severe HA. Found to have L PCA P1/P2 occlusion, taken to IR,.  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 ASPECTS 9    CTA head & neck distal L P2 occlusion. Small L ICA bifurcation web. 2.4cm R supraclavicular lesion indeterminate.   CT perfusion no core. L parietaloccipital penumbra 80mL  Cerebral angio / IR TICI3 revascularization of occluded Lt PCA P1/P2  junction w/ x 2 passes [id number] retriever and x 1 pass penumbra aspiration   Post IR CT No ICH  Or mas  effect.   MRI  L PCA infarct (temporal lobe). 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   2D Echo pending   A [facility] electrophysiologist will consult and consider placement of an implantable loop recorder to evaluate for atrial fibrillation as etiology of stroke. This has been explained to patient/family by Dr. Pearlean Brownie and they are agreeable.   LDL 93  HgbA1c 5.3  VTE prophylaxis - Lovenox 40 mg sq daily added  aspirin 325 mg daily prior to admission, now on No antithrombotic. Will add aspirin 81 and plavix 75 mg daily x 3 weeks then plavix alone    Therapy recommendations:  HH PT, HH OT   Disposition:  Return home - anticipate d/c tomorrow  Transfer to medical tele today  Blood Pressure  Home meds:  None, no hx HTN  BP goal per IR x 24h following IR procedure  . Post IR Permissive hypertension (OK if SBP < 180) but gradually normalize in 5-7 days . Long-term BP goal normotensive  Hyperlipidemia  Home meds:  No statin  Add lipitor 40  LDL 93, goal < 70  Continue statin at discharge  Other Stroke Risk Factors  Advanced age  Cigarette smoker, advised to stop smoking  On estrogen/premarin for HRT. Recommend she follow up with GYN for alternatives. She is hesitant to stop use.   Other Active Problems  Post IR, pt s/o severe low back pain. Vital s stable.  CT abdomen and pelvis neg for retroperitoneal hemorrhage. No other sig findings.  2.4cm R supraclavicular lesion indeterminate. OP w/u recommended.   Hospital day # 1 Patient is presented with embolic left PCA infarct and underwent successful mechanical thrombectomy with good clinical improvement but does have mild right-sided peripheral vision loss and weakness.  She gives me prior history of syncopal events and palpitations since may have high likelihood of paroxysmal A. fib hence will do loop recorder insertion upon discharge.  Aspirin Plavix for 3 weeks followed by aspirin  alone.  Aggressive risk factor modification.  Continue close neurological monitoring and strict blood pressure control as per post intervention protocol.  Mobilize out of bed and therapy consults Long discussion with patient and daughter at the bedside and answered questions.This patient is critically ill and at significant risk of neurological worsening, death and care requires constant monitoring of vital signs, hemodynamics,respiratory and cardiac monitoring, extensive review of multiple databases, frequent neurological assessment, discussion with family, other specialists and medical decision making of high complexity.I have made any additions or clarifications directly to the above note.This critical care time does not reflect procedure time, or teaching time or supervisory time of PA/NP/Med Resident etc but could involve care discussion time.  I spent 30 minutes of neurocritical care time  in the care of  this patient.    Delia Heady, MD  To contact Stroke Continuity provider, please refer to [email or web address]. After hours, contact General Neurology

## 2020-02-20 ENCOUNTER — Encounter (HOSPITAL_COMMUNITY)
Admission: EM | Disposition: A | Discharge status: Home Health | Payer: Self-pay | Source: Home / Self Care | Attending: Neurology

## 2020-02-20 DIAGNOSIS — I639 Cerebral infarction, unspecified: Secondary | ICD-10-CM

## 2020-02-20 HISTORY — PX: LOOP RECORDER INSERTION: EP1214

## 2020-02-20 SURGERY — LOOP RECORDER INSERTION

## 2020-02-20 MED ORDER — ATORVASTATIN CALCIUM 40 MG PO TABS
40.0000 mg | ORAL_TABLET | Freq: Every day | ORAL | 1 refills | Status: DC
Start: 2020-02-21 — End: 2020-12-14

## 2020-02-20 MED ORDER — LIDOCAINE-EPINEPHRINE 1 %-1:100000 IJ SOLN
INTRAMUSCULAR | Status: DC | PRN
  Administered 2020-02-20 (×2): 30 mL

## 2020-02-20 MED ORDER — LIDOCAINE-EPINEPHRINE 1 %-1:100000 IJ SOLN
INTRAMUSCULAR | Status: AC
  Filled 2020-02-20: qty 1

## 2020-02-20 MED ORDER — ASPIRIN 81 MG PO TBEC: 81.0000 mg | DELAYED_RELEASE_TABLET | Freq: Every day | ORAL | 0 refills | Status: AC

## 2020-02-20 MED ORDER — CLOPIDOGREL BISULFATE 75 MG PO TABS
75.0000 mg | ORAL_TABLET | Freq: Every day | ORAL | 1 refills | Status: DC
Start: 2020-02-21 — End: 2020-12-14

## 2020-02-20 SURGICAL SUPPLY — 2 items
MONITOR REVEAL LINQ II (Prosthesis & Implant Heart) ×3 IMPLANT
PACK LOOP INSERTION (CUSTOM PROCEDURE TRAY) ×3 IMPLANT

## 2020-02-20 NOTE — Consult Note (Addendum)
ELECTROPHYSIOLOGY CONSULT NOTE  Patient ID: Maria MelnickCarlita Green MRN: [id number], DOB/AGE: [birth year]   Admit date: 02/18/2020 Date of Consult: 02/20/2020  Primary Physician: Patient, No Pcp Per Primary Cardiologist: No primary care provider on file.  Primary Electrophysiologist: New to Dr. Ladona Ridgelaylor Reason for Consultation: Cryptogenic stroke; recommendations regarding Implantable Loop Recorder Insurance: MEDICARE A/B  History of Present Illness EP has been asked to evaluate Maria Melnickarlita Green for placement of an implantable loop recorder to monitor for atrial fibrillation by Dr Pearlean BrownieSethi.  The patient was admitted on 02/18/2020 with R sided weakness. Imaging demonstrated L PCA infarct s/p IR w full revascularization.  They have undergone workup for stroke including echocardiogram and CTA Head and Neck.  The patient has been monitored on telemetry which has demonstrated sinus rhythm with no arrhythmias.  Inpatient stroke work-up will not require a TEE per Neurology.   Echocardiogram this admission demonstrated 60-65%.  Lab work is reviewed.  Prior to admission, the patient denies chest pain, shortness of breath, dizziness, palpitations, or syncope.  They are recovering from their stroke with plans to return home  at discharge.  History reviewed. No pertinent past medical history.   Surgical History:  Past Surgical History:  Procedure Laterality Date   RADIOLOGY WITH ANESTHESIA N/A 02/18/2020   Procedure: IR WITH ANESTHESIA;  Surgeon: Radiologist, Medication, MD;  Location: [facility] OR;  Service: Radiology;  Laterality: N/A;     Medications Prior to Admission  Medication Sig Dispense Refill Last Dose   aspirin EC 325 MG tablet Take 325 mg by mouth as needed (for headaches).   Past Week at Unknown time   estrogens, conjugated, (PREMARIN) 0.625 MG tablet Take 0.625 mg by mouth daily.    02/17/2020 at Unknown time   naproxen sodium (ALEVE) 220 MG tablet Take 220-440 mg by mouth 2 (two) times daily as needed (for  headaches or pain).   unk at unk    Inpatient Medications:    stroke: mapping our early stages of recovery book   Does not apply Once   aspirin EC  81 mg Oral Daily   atorvastatin  40 mg Oral Daily   Chlorhexidine Gluconate Cloth  6 each Topical Daily   clopidogrel  75 mg Oral Daily   enoxaparin (LOVENOX) injection  40 mg Subcutaneous Q24H    Allergies:  Allergies  Allergen Reactions   Tape Rash and Other (See Comments)    PLEASE DO NOT USE "PLASTIC" TAPE, AS IT CAUSES RASHES AND TEARS THE SKIN- ONLY PAPER TAPE IS TOLERATED.   Bean Pod Extract Nausea Only and Other (See Comments)    No "lima beans, please"    Social History   Socioeconomic History   Marital status: Widowed    Spouse name: Not on file   Number of children: Not on file   Years of education: Not on file   Highest education level: Not on file  Occupational History   Not on file  Tobacco Use   Smoking status: Current Some Day Smoker    Types: Cigarettes   Smokeless tobacco: Never Used   Tobacco comment: 12 years  Substance and Sexual Activity   Alcohol use: Not Currently   Drug use: Not Currently   Sexual activity: Not on file  Other Topics Concern   Not on file  Social History Narrative   Not on file   Social Determinants of Health   Financial Resource Strain:    Difficulty of Paying Living Expenses: Not on file  Food Insecurity:  Worried About Programme researcher, broadcasting/film/video in the Last Year: Not on file   [employer] of Food in the Last Year: Not on file  Transportation Needs:    Lack of Transportation (Medical): Not on file   Lack of Transportation (Non-Medical): Not on file  Physical Activity:    Days of Exercise per Week: Not on file   Minutes of Exercise per Session: Not on file  Stress:    Feeling of Stress : Not on file  Social Connections:    Frequency of Communication with Friends and Family: Not on file   Frequency of Social Gatherings with Friends and Family: Not on file   Attends Religious  Services: Not on file   Active Member of Clubs or Organizations: Not on file   Attends Banker Meetings: Not on file   Marital Status: Not on file  Intimate Partner Violence:    Fear of Current or Ex-Partner: Not on file   Emotionally Abused: Not on file   Physically Abused: Not on file   Sexually Abused: Not on file     Family History  Problem Relation Age of Onset   Hypertension Mother    Hypertension Father       Review of Systems: All other systems reviewed and are otherwise negative except as noted above.  Physical Exam: Vitals:   02/19/20 2000 02/19/20 2144 02/19/20 2355 02/20/20 0350  BP: 138/66 128/60 126/75 132/66  Pulse: 82 74 73 79  Resp: 18 16 18 17   Temp: 98.7 F (37.1 C) 98.6 F (37 C) 98.3 F (36.8 C) 99.3 F (37.4 C)  TempSrc: Oral Oral Oral Oral  SpO2: 93% 97% 97% 96%  Weight:  101.6 kg    Height:  5\' 6"  (1.676 m)      GEN- The patient is well appearing, alert and oriented x 3 today.   Head- normocephalic, atraumatic Eyes-  Sclera clear, conjunctiva pink Ears- hearing intact Oropharynx- clear Neck- supple Lungs- Clear to ausculation bilaterally, normal work of breathing Heart- Regular rate and rhythm, no murmurs, rubs or gallops  GI- soft, NT, ND, + BS Extremities- no clubbing, cyanosis, or edema MS- no significant deformity or atrophy Skin- no rash or lesion Psych- euthymic mood, full affect   Labs:   Lab Results  Component Value Date   WBC 8.6 02/19/2020   HGB 13.0 02/19/2020   HCT 40.8 02/19/2020   MCV 97.1 02/19/2020   PLT 292 02/19/2020    Recent Labs  Lab 02/18/20 1212 02/18/20 1217 02/19/20 0511  NA 138   < > 140  K 4.8   < > 4.2  CL 105   < > 112*  CO2 24   < > 16*  BUN 15   < > 8  CREATININE 0.93   < > 0.66  CALCIUM 9.2   < > 9.2  PROT 6.6  --   --   BILITOT 0.5  --   --   ALKPHOS 79  --   --   ALT 12  --   --   AST 20  --   --   GLUCOSE 101*   < > 95   < > = values in this interval not displayed.       Radiology/Studies: CT ABDOMEN PELVIS WO CONTRAST  Result Date: 02/18/2020 CLINICAL DATA:  Acute abdominal pain following neuro interventional procedure. Evaluate for retroperitoneal hemorrhage. EXAM: CT ABDOMEN AND PELVIS WITHOUT CONTRAST TECHNIQUE: Multidetector CT imaging of the abdomen  and pelvis was performed following the standard protocol without IV contrast. COMPARISON:  None. FINDINGS: Lower chest: Mild dependent atelectasis at both lung bases. No basilar pneumothorax or significant pleural or pericardial effusion. Hepatobiliary: No focal hepatic abnormalities are identified. There is mild extrahepatic biliary dilatation post cholecystectomy, likely physiologic. Pancreas: Unremarkable. No pancreatic ductal dilatation or surrounding inflammatory changes. Spleen: Normal in size without focal abnormality. Adrenals/Urinary Tract: Small low-density adrenal nodules bilaterally consistent with adenomas. The right kidney is surgically absent. There is no mass in the nephrectomy bed. There are small cysts in the interpolar region of the left kidney. No hydronephrosis. The bladder is moderately distended with contrast. Stomach/Bowel: No evidence of bowel wall thickening, distention or surrounding inflammatory change. There are postsurgical changes in the stomach consistent with previous gastrojejunostomy. There is a small hiatal hernia. Vascular/Lymphatic: There are no enlarged abdominal or pelvic lymph nodes. Minimal aortic and branch vessel atherosclerosis. No evidence of retroperitoneal hematoma. There is minimal soft tissue stranding in the right groin. Reproductive: Hysterectomy.  No adnexal mass. Other: No hemoperitoneum, ascites or free intraperitoneal air. Postsurgical changes in the anterior abdominal wall without significant recurrent hernia. Musculoskeletal: No acute or significant osseous findings. IMPRESSION: 1. No acute findings or explanation for the patient's symptoms. No evidence of  retroperitoneal hematoma. Minimal soft tissue stranding in the right groin, nonspecific. 2. Postsurgical changes as described. 3. Aortic Atherosclerosis (ICD10-I70.0). Electronically Signed   By: Carey Bullocks M.D.   On: 02/18/2020 16:21   MR BRAIN WO CONTRAST  Result Date: 02/19/2020 CLINICAL DATA:  Right lower extremity weakness.  Headache. EXAM: MRI HEAD WITHOUT CONTRAST TECHNIQUE: Multiplanar, multiecho pulse sequences of the brain and surrounding structures were obtained without intravenous contrast. COMPARISON:  None. FINDINGS: Brain: There is abnormal diffusion restriction within the medial left temporal lobe. Punctate focus of diffusion restriction at the superior aspect of the left thalamus. Old bilateral cerebellar infarcts. Multifocal hyperintense T2-weighted signal within the white matter. Normal volume of CSF spaces. No chronic microhemorrhage. Normal midline structures. Vascular: Normal flow voids. Skull and upper cervical spine: Normal marrow signal. Sinuses/Orbits: Negative. Other: None. IMPRESSION: 1. Multifocal acute ischemia within the left PCA territory, predominantly in the medial left temporal lobe. No hemorrhage or mass effect. 2. 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 Electronically Signed   By: Deatra Robinson M.D.   On: 02/19/2020 01:49   CT CEREBRAL PERFUSION W CONTRAST  Result Date: 02/18/2020 CLINICAL DATA:  Neuro deficit EXAM: CT PERFUSION BRAIN TECHNIQUE: Multiphase CT imaging of the brain was performed following IV bolus contrast injection. Subsequent parametric perfusion maps were calculated using RAPID software. CONTRAST:  75mL OMNIPAQUE IOHEXOL 350 MG/ML SOLN COMPARISON:  Prior same day noncontrast head CT. FINDINGS: CT Brain Perfusion Findings: CBF (<30%) Volume: 0mL Perfusion (Tmax>6.0s) volume: 19mL Mismatch Volume: 19mL ASPECTS on noncontrast CT Head: 9 at 12:32 p.m. today. Infarct Core: 0 mL Infarction Location:No infarct core  demonstrated on perfusion imaging. IMPRESSION: Ischemia involving the left parietal and occipitotemporal regions. No core identified on perfusion imaging. Electronically Signed   By: Stana Bunting M.D.   On: 02/18/2020 12:51   DG CHEST PORT 1 VIEW  Result Date: 02/18/2020 CLINICAL DATA:  Right-sided arm weakness, right-sided facial droop, CVA EXAM: PORTABLE CHEST 1 VIEW COMPARISON:  None. FINDINGS: The heart size and mediastinal contours are within normal limits. Both lungs are clear. The visualized skeletal structures are unremarkable. IMPRESSION: No active disease. Electronically Signed   By: Maxwell Caul.D.  On: 02/18/2020 19:28   ECHOCARDIOGRAM COMPLETE  Result Date: 02/19/2020    ECHOCARDIOGRAM REPORT   Patient Name:   NANAKO STOPHER Date of Exam: 02/19/2020 Medical Rec #:  [id number]     Height:       64.0 in Accession #:    [id number]    Weight: Date of Birth:  July 30, 1950    BSA: Patient Age:    68 years      BP:           129/67 mmHg Patient Gender: F             HR:           72 bpm. Exam Location:  Inpatient Procedure: 2D Echo, Color Doppler, Cardiac Doppler and Intracardiac            Opacification Agent Indications:    CVA  History:        Patient has no prior history of Echocardiogram examinations.                 Stroke; Risk Factors:Hypertension and Dyslipidemia.  Sonographer:    Lavenia Atlas Referring Phys: [phone] DAVID R SMITH  Sonographer Comments: Suboptimal parasternal window and patient is morbidly obese. Image acquisition challenging due to patient body habitus and Image acquisition challenging due to respiratory motion. IMPRESSIONS  1. Left ventricular ejection fraction, by estimation, is 60 to 65%. The left ventricle has normal function. The left ventricle has no regional wall motion abnormalities. Left ventricular diastolic parameters were normal.  2. Right ventricular systolic function is normal. The right ventricular size is normal.  3. The mitral valve is normal in  structure. No evidence of mitral valve regurgitation. No evidence of mitral stenosis.  4. The aortic valve is normal in structure. Aortic valve regurgitation is not visualized. No aortic stenosis is present.  5. The inferior vena cava is normal in size with greater than 50% respiratory variability, suggesting right atrial pressure of 3 mmHg. Conclusion(s)/Recommendation(s): No intracardiac source of embolism detected on this transthoracic study. A transesophageal echocardiogram should be considered to exclude cardiac source of embolism if clinically indicated. FINDINGS  Left Ventricle: Left ventricular ejection fraction, by estimation, is 60 to 65%. The left ventricle has normal function. The left ventricle has no regional wall motion abnormalities. Definity contrast agent was given IV to delineate the left ventricular  endocardial borders. The left ventricular internal cavity size was normal in size. There is no left ventricular hypertrophy. Left ventricular diastolic parameters were normal. Right Ventricle: The right ventricular size is normal. No increase in right ventricular wall thickness. Right ventricular systolic function is normal. Left Atrium: Left atrial size was normal in size. Right Atrium: Right atrial size was normal in size. Pericardium: There is no evidence of pericardial effusion. Mitral Valve: The mitral valve is normal in structure. Normal mobility of the mitral valve leaflets. No evidence of mitral valve regurgitation. No evidence of mitral valve stenosis. Tricuspid Valve: The tricuspid valve is normal in structure. Tricuspid valve regurgitation is not demonstrated. No evidence of tricuspid stenosis. Aortic Valve: The aortic valve is normal in structure. Aortic valve regurgitation is not visualized. No aortic stenosis is present. Pulmonic Valve: The pulmonic valve was normal in structure. Pulmonic valve regurgitation is not visualized. No evidence of pulmonic stenosis. Aorta: The aortic root is  normal in size and structure. Venous: The inferior vena cava is normal in size with greater than 50% respiratory variability, suggesting right atrial pressure of 3 mmHg. IAS/Shunts: No  atrial level shunt detected by color flow Doppler.  LEFT VENTRICLE PLAX 2D LVIDd:         4.40 cm  Diastology LVIDs:         3.10 cm  LV e' lateral:   7.07 cm/s LV PW:         1.10 cm  LV E/e' lateral: 10.8 LV IVS:        1.10 cm  LV e' medial:    4.35 cm/s LVOT diam:     2.00 cm  LV E/e' medial:  17.6 LV SV:         62 LVOT Area:     3.14 cm  RIGHT VENTRICLE RV Basal diam:  2.80 cm RV S prime:     10.10 cm/s TAPSE (M-mode): 2.9 cm LEFT ATRIUM             RIGHT ATRIUM LA diam:        3.00 cm RA Area:     11.30 cm LA Vol (A2C):   29.4 ml RA Volume:   24.30 ml LA Vol (A4C):   28.4 ml LA Biplane Vol: 30.9 ml  AORTIC VALVE LVOT Vmax:   94.60 cm/s LVOT Vmean:  56.300 cm/s LVOT VTI:    0.198 m  AORTA Ao Root diam: 3.10 cm MITRAL VALVE MV Area (PHT): 3.99 cm    SHUNTS MV Decel Time: 190 msec    Systemic VTI:  0.20 m MV E velocity: 76.70 cm/s  Systemic Diam: 2.00 cm MV A velocity: 73.70 cm/s MV E/A ratio:  1.04 Mihai Croitoru MD Electronically signed by Thurmon Fair MD Signature Date/Time: 02/19/2020/5:55:40 PM    Final    CT HEAD CODE STROKE WO CONTRAST  Result Date: 02/18/2020 CLINICAL DATA:  Code stroke.  Neuro deficit EXAM: CT HEAD WITHOUT CONTRAST TECHNIQUE: Contiguous axial images were obtained from the base of the skull through the vertex without intravenous contrast. COMPARISON:  None. FINDINGS: Brain: No intracranial hemorrhage. Hypodense focus extending into the cortex within the left frontoparietal region is concerning for subacute insult (5:47). No mass lesion. No midline shift, ventriculomegaly or extra-axial fluid collection. Vascular: No hyperdense vessel or unexpected calcification. Skull: Negative for fracture or focal lesion. Sinuses/Orbits: No acute finding. Other: None. ASPECTS [facility] Stroke Program Early CT  Score) - Ganglionic level infarction (caudate, lentiform nuclei, internal capsule, insula, M1-M3 cortex): 6 - Supraganglionic infarction (M4-M6 cortex): 3 Total score (0-10 with 10 being normal): 9 IMPRESSION: 1. 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 2. ASPECTS is 9 Code stroke imaging results were communicated on 02/18/2020 at 12:23 pm to provider Dr. Ezzie Dural Via telephone, who verbally acknowledged these results. Electronically Signed   By: Stana Bunting M.D.   On: 02/18/2020 12:32   CT ANGIO HEAD CODE STROKE  Result Date: 02/18/2020 CLINICAL DATA:  Neuro deficit EXAM: CT ANGIOGRAPHY HEAD AND NECK TECHNIQUE: Multidetector CT imaging of the head and neck was performed using the standard protocol during bolus administration of intravenous contrast. Multiplanar CT image reconstructions and MIPs were obtained to evaluate the vascular anatomy. Carotid stenosis measurements (when applicable) are obtained utilizing NASCET criteria, using the distal internal carotid diameter as the denominator. CONTRAST:  61mL OMNIPAQUE IOHEXOL 350 MG/ML SOLN COMPARISON:  Concurrent noncontrast head CT and CT cerebral perfusion. FINDINGS: CTA NECK FINDINGS Aortic arch: Standard branching. Imaged portion shows no evidence of aneurysm or dissection. No significant stenosis of the major arch vessel origins. Right carotid system: No evidence of dissection, stenosis (50% or greater) or occlusion.  Retropharyngeal course of the common and internal carotid arteries. Left carotid system: No evidence of dissection, stenosis (50% or greater) or occlusion. Retropharyngeal common carotid artery. Mild bifurcation atherosclerotic calcifications without significant luminal narrowing. Small web at the level of the bifurcation (6:27). Vertebral arteries: Codominant and diminutive. No evidence of dissection, stenosis (50% or greater) or occlusion. Skeleton: Multilevel spondylosis.  No acute osseous abnormality. Other neck: 2.4 x 1.7 cm right  supraclavicular lesion, nonenhancing on arterial phase imaging (4:62). Bilateral thyroid hypoenhancing foci measuring up to 0.5 cm. No adenopathy by size criteria. Upper chest: Atelectasis. Review of the MIP images confirms the above findings CTA HEAD FINDINGS Anterior circulation: Normal caliber patent appearance of the bilateral internal, anterior and middle cerebral arteries. The left PCOM may be hypoplastic or absent. No significant stenosis, proximal occlusion, aneurysm, or vascular malformation. Posterior circulation: Diminutive, codominant vertebral arteries. Persistent left hypoglossal artery. Normal caliber patent basilar artery. Dominant right AICA. Proximally patent bilateral superior cerebellar arteries. Patent normal caliber right posterior cerebral artery. There is occlusion of the distal left P2 segment (3:119) with non opacification distally. Venous sinuses: As permitted by contrast timing, patent. Anatomic variants: Persistent left hypoglossal artery (4:201). Review of the MIP images confirms the above findings IMPRESSION: Distal left P2 segment occlusion. No large vessel occlusion or high-grade narrowing involving the anterior circulation. Persistent left hypoglossal artery terminating as the basilar. Diminutive vertebral arteries. Small left carotid web at the level of the bifurcation. 2.4 cm right supraclavicular lesion is indeterminate. Outpatient workup with CT neck soft tissue with contrast is recommended. Code stroke imaging results were communicated on 02/18/2020 at 12:53 pm to provider Dr. Ezzie Dural Via telephone, who verbally acknowledged these results. Electronically Signed   By: Stana Bunting M.D.   On: 02/18/2020 13:21   CT ANGIO NECK CODE STROKE  Result Date: 02/18/2020 CLINICAL DATA:  Neuro deficit EXAM: CT ANGIOGRAPHY HEAD AND NECK TECHNIQUE: Multidetector CT imaging of the head and neck was performed using the standard protocol during bolus administration of intravenous contrast.  Multiplanar CT image reconstructions and MIPs were obtained to evaluate the vascular anatomy. Carotid stenosis measurements (when applicable) are obtained utilizing NASCET criteria, using the distal internal carotid diameter as the denominator. CONTRAST:  24mL OMNIPAQUE IOHEXOL 350 MG/ML SOLN COMPARISON:  Concurrent noncontrast head CT and CT cerebral perfusion. FINDINGS: CTA NECK FINDINGS Aortic arch: Standard branching. Imaged portion shows no evidence of aneurysm or dissection. No significant stenosis of the major arch vessel origins. Right carotid system: No evidence of dissection, stenosis (50% or greater) or occlusion. Retropharyngeal course of the common and internal carotid arteries. Left carotid system: No evidence of dissection, stenosis (50% or greater) or occlusion. Retropharyngeal common carotid artery. Mild bifurcation atherosclerotic calcifications without significant luminal narrowing. Small web at the level of the bifurcation (6:27). Vertebral arteries: Codominant and diminutive. No evidence of dissection, stenosis (50% or greater) or occlusion. Skeleton: Multilevel spondylosis.  No acute osseous abnormality. Other neck: 2.4 x 1.7 cm right supraclavicular lesion, nonenhancing on arterial phase imaging (4:62). Bilateral thyroid hypoenhancing foci measuring up to 0.5 cm. No adenopathy by size criteria. Upper chest: Atelectasis. Review of the MIP images confirms the above findings CTA HEAD FINDINGS Anterior circulation: Normal caliber patent appearance of the bilateral internal, anterior and middle cerebral arteries. The left PCOM may be hypoplastic or absent. No significant stenosis, proximal occlusion, aneurysm, or vascular malformation. Posterior circulation: Diminutive, codominant vertebral arteries. Persistent left hypoglossal artery. Normal caliber patent basilar artery. Dominant right AICA. Proximally patent bilateral  superior cerebellar arteries. Patent normal caliber right posterior cerebral  artery. There is occlusion of the distal left P2 segment (3:119) with non opacification distally. Venous sinuses: As permitted by contrast timing, patent. Anatomic variants: Persistent left hypoglossal artery (4:201). Review of the MIP images confirms the above findings IMPRESSION: Distal left P2 segment occlusion. No large vessel occlusion or high-grade narrowing involving the anterior circulation. Persistent left hypoglossal artery terminating as the basilar. Diminutive vertebral arteries. Small left carotid web at the level of the bifurcation. 2.4 cm right supraclavicular lesion is indeterminate. Outpatient workup with CT neck soft tissue with contrast is recommended. Code stroke imaging results were communicated on 02/18/2020 at 12:53 pm to provider Dr. Ezzie Dural Via telephone, who verbally acknowledged these results. Electronically Signed   By: Stana Bunting M.D.   On: 02/18/2020 13:21    12-lead ECG NSR at 69 bpm (personally reviewed) All prior EKG's in EPIC reviewed with no documented atrial fibrillation  Telemetry NSR 70-80s (personally reviewed)  Assessment and Plan:  1. Cryptogenic stroke The patient presents with cryptogenic stroke.  The patient does not have a TEE planned for this AM.  I spoke at length with the patient about monitoring for afib with an implantable loop recorder.  Risks, benefits, and alteratives to implantable loop recorder were discussed with the patient today.   At this time, the patient is very clear in their decision to proceed with implantable loop recorder.   Wound care was reviewed with the patient (keep incision clean and dry for 3 days).  Wound check scheduled and entered in AVS. Please call with questions.   Graciella Freer, PA-C 02/20/2020 8:05 AM  EP Attending  Patient seen and examined. Agree with above. The patient has had a cryptogenic stroke is s/p percutaneous intervention with nice improvement in her neurologic symptoms. I have discussed the  indications for ILR insertion and she wishes to proceed.  Sharlot Gowda Rosabel Sermeno,MD

## 2020-02-20 NOTE — Discharge Summary (Addendum)
Stroke Discharge Summary  Patient ID: Maria Green   MRN: [id number]      DOB: 1951-01-13  Date of Admission: 02/18/2020 Date of Discharge: 02/20/2020  Attending Physician:  Garvin Fila, MD, Stroke MD Consultant(s):   Treatment Team:  [facility], Rounding, MD cardiology Barrington Ellison, PA Patient's PCP:  Patient, No Pcp Per  DISCHARGE DIAGNOSIS:  Active Problems:   Stroke [facility])   Stroke (cerebrum) [facility])   Posterior cerebral artery embolism, left- cryptogenic   Allergies as of 02/20/2020       Reactions   Tape Rash, Other (See Comments)   PLEASE DO NOT USE "PLASTIC" TAPE, AS IT CAUSES RASHES AND TEARS THE SKIN- ONLY PAPER TAPE IS TOLERATED.   Bean Pod Extract Nausea Only, Other (See Comments)   No "lima beans, please"        Medication List     STOP taking these medications    estrogens (conjugated) 0.625 MG tablet Commonly known as: PREMARIN   naproxen sodium 220 MG tablet Commonly known as: ALEVE       TAKE these medications    aspirin 81 MG EC tablet Take 1 tablet (81 mg total) by mouth daily. Swallow whole. Start taking on: February 21, 2020 What changed:  medication strength how much to take when to take this reasons to take this additional instructions   atorvastatin 40 MG tablet Commonly known as: LIPITOR Take 1 tablet (40 mg total) by mouth daily. Start taking on: February 21, 2020   clopidogrel 75 MG tablet Commonly known as: PLAVIX Take 1 tablet (75 mg total) by mouth daily. Start taking on: February 21, 2020               Durable Medical Equipment  (From admission, onward)           Start     Ordered   02/20/20 1142  For home use only DME 3 n 1  Once        02/20/20 1141            LABORATORY STUDIES CBC    Component Value Date/Time   WBC 8.6 02/19/2020 0816   RBC 4.20 02/19/2020 0816   HGB 13.0 02/19/2020 0816   HCT 40.8 02/19/2020 0816   PLT 292 02/19/2020 0816   MCV 97.1 02/19/2020 0816   MCH  31.0 02/19/2020 0816   MCHC 31.9 02/19/2020 0816   RDW 14.6 02/19/2020 0816   LYMPHSABS 1.9 02/19/2020 0816   MONOABS 0.6 02/19/2020 0816   EOSABS 0.1 02/19/2020 0816   BASOSABS 0.0 02/19/2020 0816   CMP    Component Value Date/Time   NA 140 02/19/2020 0511   K 4.2 02/19/2020 0511   CL 112 (H) 02/19/2020 0511   CO2 16 (L) 02/19/2020 0511   GLUCOSE 95 02/19/2020 0511   BUN 8 02/19/2020 0511   CREATININE 0.66 02/19/2020 0511   CALCIUM 9.2 02/19/2020 0511   PROT 6.6 02/18/2020 1212   ALBUMIN 3.2 (L) 02/18/2020 1212   AST 20 02/18/2020 1212   ALT 12 02/18/2020 1212   ALKPHOS 79 02/18/2020 1212   BILITOT 0.5 02/18/2020 1212   GFRNONAA >60 02/19/2020 0511   GFRAA >60 02/19/2020 0511   COAGS Lab Results  Component Value Date   INR 0.9 02/18/2020   Lipid Panel    Component Value Date/Time   CHOL 186 02/19/2020 0511   TRIG 142 02/19/2020 0511   HDL 65 02/19/2020 0511   CHOLHDL 2.9 02/19/2020  0511   VLDL 28 02/19/2020 0511   LDLCALC 93 02/19/2020 0511   HgbA1C  Lab Results  Component Value Date   HGBA1C 5.3 02/19/2020   Urinalysis    Component Value Date/Time   COLORURINE YELLOW 02/18/2020 1700   APPEARANCEUR CLEAR 02/18/2020 1700   LABSPEC 1.033 (H) 02/18/2020 1700   PHURINE 6.0 02/18/2020 1700   GLUCOSEU NEGATIVE 02/18/2020 1700   HGBUR NEGATIVE 02/18/2020 1700   BILIRUBINUR NEGATIVE 02/18/2020 1700   KETONESUR NEGATIVE 02/18/2020 1700   PROTEINUR NEGATIVE 02/18/2020 1700   NITRITE POSITIVE (A) 02/18/2020 1700   LEUKOCYTESUR NEGATIVE 02/18/2020 1700   Urine Drug Screen No results found for: LABOPIA, COCAINSCRNUR, LABBENZ, AMPHETMU, THCU, LABBARB  Alcohol Level No results found for: [facility]   SIGNIFICANT DIAGNOSTIC STUDIES CT ABDOMEN PELVIS WO CONTRAST  Result Date: 02/18/2020 CLINICAL DATA:  Acute abdominal pain following neuro interventional procedure. Evaluate for retroperitoneal hemorrhage. EXAM: CT ABDOMEN AND PELVIS WITHOUT CONTRAST TECHNIQUE:  Multidetector CT imaging of the abdomen and pelvis was performed following the standard protocol without IV contrast. COMPARISON:  None. FINDINGS: Lower chest: Mild dependent atelectasis at both lung bases. No basilar pneumothorax or significant pleural or pericardial effusion. Hepatobiliary: No focal hepatic abnormalities are identified. There is mild extrahepatic biliary dilatation post cholecystectomy, likely physiologic. Pancreas: Unremarkable. No pancreatic ductal dilatation or surrounding inflammatory changes. Spleen: Normal in size without focal abnormality. Adrenals/Urinary Tract: Small low-density adrenal nodules bilaterally consistent with adenomas. The right kidney is surgically absent. There is no mass in the nephrectomy bed. There are small cysts in the interpolar region of the left kidney. No hydronephrosis. The bladder is moderately distended with contrast. Stomach/Bowel: No evidence of bowel wall thickening, distention or surrounding inflammatory change. There are postsurgical changes in the stomach consistent with previous gastrojejunostomy. There is a small hiatal hernia. Vascular/Lymphatic: There are no enlarged abdominal or pelvic lymph nodes. Minimal aortic and branch vessel atherosclerosis. No evidence of retroperitoneal hematoma. There is minimal soft tissue stranding in the right groin. Reproductive: Hysterectomy.  No adnexal mass. Other: No hemoperitoneum, ascites or free intraperitoneal air. Postsurgical changes in the anterior abdominal wall without significant recurrent hernia. Musculoskeletal: No acute or significant osseous findings. IMPRESSION: 1. No acute findings or explanation for the patient's symptoms. No evidence of retroperitoneal hematoma. Minimal soft tissue stranding in the right groin, nonspecific. 2. Postsurgical changes as described. 3. Aortic Atherosclerosis (ICD10-I70.0). Electronically Signed   By: Richardean Sale M.D.   On: 02/18/2020 16:21   MR BRAIN WO  CONTRAST  Result Date: 02/19/2020 CLINICAL DATA:  Right lower extremity weakness.  Headache. EXAM: MRI HEAD WITHOUT CONTRAST TECHNIQUE: Multiplanar, multiecho pulse sequences of the brain and surrounding structures were obtained without intravenous contrast. COMPARISON:  None. FINDINGS: Brain: There is abnormal diffusion restriction within the medial left temporal lobe. Punctate focus of diffusion restriction at the superior aspect of the left thalamus. Old bilateral cerebellar infarcts. Multifocal hyperintense T2-weighted signal within the white matter. Normal volume of CSF spaces. No chronic microhemorrhage. Normal midline structures. Vascular: Normal flow voids. Skull and upper cervical spine: Normal marrow signal. Sinuses/Orbits: Negative. Other: None. IMPRESSION: 1. Multifocal acute ischemia within the left PCA territory, predominantly in the medial left temporal lobe. No hemorrhage or mass effect. 2. 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 Electronically Signed   By: Ulyses Jarred M.D.   On: 02/19/2020 01:49   EP PPM/ICD IMPLANT  Result Date: 02/20/2020 CONCLUSIONS:  1. Successful implantation of a Medtronic Reveal LINQ implantable loop  recorder for cryptogenic stroke  2. No early apparent complications. Cristopher Peru, MD 02/20/2020 11:24 AM   IR CT Head Ltd  Result Date: 02/20/2020 INDICATION: New onset right-sided weakness, right facial droop and incoordination. Occluded left posterior cerebral artery distal P1 P2 junction on CT angiogram of the head and neck. EXAM: 1. EMERGENT LARGE VESSEL OCCLUSION THROMBOLYSIS (POSTERIOR CIRCULATION) COMPARISON:  CT angiogram of the head and neck of February 18, 2020. MEDICATIONS: Ancef 2 g IV antibiotic was administered within 1 hour of the procedure. ANESTHESIA/SEDATION: General anesthesia CONTRAST:  Isovue 300 approximately 100 mL FLUOROSCOPY TIME:  Fluoroscopy Time: 42 minutes 42 seconds (2336 mGy). COMPLICATIONS: None  immediate. TECHNIQUE: Following a full explanation of the procedure along with the potential associated complications, an informed witnessed consent was obtained from the patient. The risks of intracranial hemorrhage of 10%, worsening neurological deficit, ventilator dependency, death and inability to revascularize were all reviewed in detail with the patient. The patient was then put under general anesthesia by the Department of Anesthesiology at [facility]. The right groin was prepped and draped in the usual sterile fashion. Thereafter using modified Seldinger technique, transfemoral access into the right common femoral artery was obtained without difficulty. Over a 0.035 inch guidewire a 8 French 25 cm Pinnacle sheath was inserted. Through this, and also over a 0.035 inch guidewire a 5 [place] JB 1 catheter was advanced to the aortic arch region and selectively positioned in the left common carotid artery. FINDINGS: The left common carotid arteriogram just proximal to the bifurcation demonstrates left external carotid artery and its major branches to be widely patent. The left internal carotid artery appears abnormally prominent with mild tortuosity proximally. The left internal carotid artery opacifies to the cranial skull base. The petrous, the cavernous and the supraclinoid segments are widely patent. The left middle cerebral artery and the left anterior cerebral artery opacify into the capillary and venous phases. A prominent hypoglossal artery is seen arising from the medial aspect of the proximal left internal carotid artery with moderate tortuosity. This continues to the basilar artery, the right posterior cerebral artery, the superior cerebral artery and the anterior-inferior cerebellar arteries bilaterally into the capillary and venous phases. The left posterior cerebral artery demonstrates complete occlusion at the P1 P2 junction. PROCEDURE: The diagnostic JB 1 catheter in the left common  carotid artery was then exchanged over a 0.035 inch 300 cm Rosen exchange guidewire for a 95 cm 088 TracStar Zoom catheter which was positioned in the proximal left internal carotid artery. The guidewire was removed. Good aspiration was obtained from the TracStar guide catheter. A control arteriogram was then performed just proximal to the hypoglossal artery. Over a 0.014 inch standard Synchro micro guidewire with a J-tip configuration, an 021 Trevo ProVue microcatheter inside of a 132 cm 55 Zoom aspiration catheter combination was then advanced to the distal basilar artery. Using a torque device, the micro guidewire was then advanced into the left posterior cerebral artery and into the proximal P3 region. Further advancement of the micro guidewire was met with resistance. The microcatheter was then advanced to just the mid P3 segment. Guidewire was removed. Good aspiration obtained from the hub of the microcatheter. A gentle control arteriogram performed through the microcatheter demonstrated attenuated branches of the left posterior cerebral artery P3 region. This was then connected to continuous heparinized saline infusion. The 55 Zoom catheter was advanced into the proximal left P1 segment. A Tiger 17 retrieval device was then prepped and purged with heparinized  saline infusion. This was then advanced in a coaxial manner and with constant heparinized saline infusion to the distal end of the microcatheter. The O ring on the delivery microcatheter was loosened. With slight forward gentle traction with right hand on the delivery micro guidewire, the microcatheter was retrieved unsheathing the retrieval device. Control clicking of the retrieval was then performed expanding its diameter within the parent vessel. The massaging technique was then performed of the retrieval device. Aspiration was then continued through the aspiration catheter for approximately 2 minutes. Thereafter, the Tiger retrieval device was gently  retrieved into the aspiration catheter and removed. A small amount of clot was seen in the interstices of the Tiger 17 retrieval device. A control arteriogram performed through the aspiration catheter in the proximal left posterior cerebral artery demonstrated modest improvement in the caliber of the left posterior cerebral artery P2 P3 region. Further attempts with the [facility] retriever were not undertaken in view of the resistance to the advancement of the microcatheter more distally. A 35 160 cm aspiration was then advanced over a 0.014 inch standard Synchro micro guidewire through the 55 aspiration catheter. The micro guidewire was advanced through the occluded P2 segment without difficulty followed by the 35 aspiration catheter which was then imbedded into the occlusion. The guidewire was removed. Aspiration of with a Penumbra aspiration device was continued for approximately 2-1/2 minutes. Aspiration via a 60 mL syringe was also continued through the 55 aspiration catheter. The 35 aspiration catheter was retrieved and removed. The 55 aspiration catheter was retrieved more proximally. Aspiration through the 35 catheter was locked and was retrieved and removed. Free aspiration obtained from the hub of the 55 aspiration catheter. A control arteriogram performed through the 55 aspiration catheter demonstrated complete revascularization of the left posterior cerebral artery P2 and P3 segments. No intraluminal filling defects were noted. Moderate spasm responded to 25 mcg of nitroglycerin intra-arterially. A final control arteriogram performed through the 55 aspiration catheter in the proximal basilar artery demonstrated wide patency of the left posterior cerebral artery achieving a TICI 3 revascularization. The remaining posterior circulation branches remain widely patent. A control arteriogram performed through the 8 [place] TracStar catheter in the left internal carotid approximately demonstrated wide patency of the  left internal carotid artery intra and extra cranially and also of the intracranial vasculature. The TracStar was removed. The 8 French Pinnacle sheath was then replaced with an 8 French Angio-Seal closure device with hemostasis. The right dorsalis pedis and posterior tibial pulses were palpable. The left posterior tibial and dorsalis pedis branches were now Dopplerable. A CT scan of the brain obtained on the table demonstrated no evidence intracranial hemorrhage, mass effect or midline shift. The patient's general anesthesia was then reversed and the patient was then extubated without difficulty. The patient was moving her upper and lower extremities spontaneously. However, she complained of severe low back pain. Vital signs including the blood pressure remained stable. However, given the severe low back pain, a CT of the abdomen and pelvis was performed on an emergent basis which demonstrated no evidence of retroperitoneal hemorrhage or other significant abnormalities. This pain gradually subsided and the patient was transferred to neuro ICU for post thrombectomy management. 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 Status post endovascular complete revascularization of occluded left posterior cerebral artery distal P1 P2 junction with 1 pass with the Tiger 17 retrieval device, and 1 pass with 55 aspiration catheter  with Penumbra aspiration achieving a TICI 3 revascularization. PLAN. Follow up with neurology Electronically Signed   By: Luanne Bras M.D.   On: 02/19/2020 11:51   CT CEREBRAL PERFUSION W CONTRAST  Result Date: 02/18/2020 CLINICAL DATA:  Neuro deficit EXAM: CT PERFUSION BRAIN TECHNIQUE: Multiphase CT imaging of the brain was performed following IV bolus contrast injection. Subsequent parametric perfusion maps were calculated using RAPID software. CONTRAST:  59m OMNIPAQUE  IOHEXOL 350 MG/ML SOLN COMPARISON:  Prior same day noncontrast head CT. FINDINGS: CT Brain Perfusion Findings: CBF (<30%) Volume: 053mPerfusion (Tmax>6.0s) volume: 1962mismatch Volume: 43m51mPECTS on noncontrast CT Head: 9 at 12:32 p.m. today. Infarct Core: 0 mL Infarction Location:No infarct core demonstrated on perfusion imaging. IMPRESSION: Ischemia involving the left parietal and occipitotemporal regions. No core identified on perfusion imaging. Electronically Signed   By: ChikPrimitivo Gauze.   On: 02/18/2020 12:51   DG CHEST PORT 1 VIEW  Result Date: 02/18/2020 CLINICAL DATA:  Right-sided arm weakness, right-sided facial droop, CVA EXAM: PORTABLE CHEST 1 VIEW COMPARISON:  None. FINDINGS: The heart size and mediastinal contours are within normal limits. Both lungs are clear. The visualized skeletal structures are unremarkable. IMPRESSION: No active disease. Electronically Signed   By: MichRanda Ngo.   On: 02/18/2020 19:28   ECHOCARDIOGRAM COMPLETE  Result Date: 02/19/2020    ECHOCARDIOGRAM REPORT   Patient Name:   CARLCARMELIA TINERe of Exam: 02/19/2020 Medical Rec #:  [id number] Height:       64.0 in Accession #:    [id number]: Date of Birth:  [birth year]BSA: Patient Age:    68 y49rs      BP:           129/67 mmHg Patient Gender: F             HR:           72 bpm. Exam Location:  Inpatient Procedure: 2D Echo, Color Doppler, Cardiac Doppler and Intracardiac            Opacification Agent Indications:    CVA  History:        Patient has no prior history of Echocardiogram examinations.                 Stroke; Risk Factors:Hypertension and Dyslipidemia.  Sonographer:    BrooDustin Flockerring Phys: [id number]: Suboptimal parasternal window and patient is morbidly obese. Image acquisition challenging due to patient body habitus and Image acquisition challenging due to respiratory motion. IMPRESSIONS  1. Left ventricular ejection fraction, by  estimation, is 60 to 65%. The left ventricle has normal function. The left ventricle has no regional wall motion abnormalities. Left ventricular diastolic parameters were normal.  2. Right ventricular systolic function is normal. The right ventricular size is normal.  3. The mitral valve is normal in structure. No evidence of mitral valve regurgitation. No evidence of mitral stenosis.  4. The aortic valve is normal in structure. Aortic valve regurgitation is not visualized. No aortic stenosis is present.  5. The inferior vena cava is normal in size with greater than 50% respiratory variability, suggesting right atrial pressure of 3 mmHg. Conclusion(s)/Recommendation(s): No intracardiac source of embolism detected on this transthoracic study. A transesophageal echocardiogram should be considered to exclude cardiac source of embolism if clinically indicated. FINDINGS  Left Ventricle: Left ventricular ejection fraction, by estimation, is 60 to 65%. The left  ventricle has normal function. The left ventricle has no regional wall motion abnormalities. Definity contrast agent was given IV to delineate the left ventricular  endocardial borders. The left ventricular internal cavity size was normal in size. There is no left ventricular hypertrophy. Left ventricular diastolic parameters were normal. Right Ventricle: The right ventricular size is normal. No increase in right ventricular wall thickness. Right ventricular systolic function is normal. Left Atrium: Left atrial size was normal in size. Right Atrium: Right atrial size was normal in size. Pericardium: There is no evidence of pericardial effusion. Mitral Valve: The mitral valve is normal in structure. Normal mobility of the mitral valve leaflets. No evidence of mitral valve regurgitation. No evidence of mitral valve stenosis. Tricuspid Valve: The tricuspid valve is normal in structure. Tricuspid valve regurgitation is not demonstrated. No evidence of tricuspid  stenosis. Aortic Valve: The aortic valve is normal in structure. Aortic valve regurgitation is not visualized. No aortic stenosis is present. Pulmonic Valve: The pulmonic valve was normal in structure. Pulmonic valve regurgitation is not visualized. No evidence of pulmonic stenosis. Aorta: The aortic root is normal in size and structure. Venous: The inferior vena cava is normal in size with greater than 50% respiratory variability, suggesting right atrial pressure of 3 mmHg. IAS/Shunts: No atrial level shunt detected by color flow Doppler.  LEFT VENTRICLE PLAX 2D LVIDd:         4.40 cm  Diastology LVIDs:         3.10 cm  LV e' lateral:   7.07 cm/s LV PW:         1.10 cm  LV E/e' lateral: 10.8 LV IVS:        1.10 cm  LV e' medial:    4.35 cm/s LVOT diam:     2.00 cm  LV E/e' medial:  17.6 LV SV:         62 LVOT Area:     3.14 cm  RIGHT VENTRICLE RV Basal diam:  2.80 cm RV S prime:     10.10 cm/s TAPSE (M-mode): 2.9 cm LEFT ATRIUM             RIGHT ATRIUM LA diam:        3.00 cm RA Area:     11.30 cm LA Vol (A2C):   29.4 ml RA Volume:   24.30 ml LA Vol (A4C):   28.4 ml LA Biplane Vol: 30.9 ml  AORTIC VALVE LVOT Vmax:   94.60 cm/s LVOT Vmean:  56.300 cm/s LVOT VTI:    0.198 m  AORTA Ao Root diam: 3.10 cm MITRAL VALVE MV Area (PHT): 3.99 cm    SHUNTS MV Decel Time: 190 msec    Systemic VTI:  0.20 m MV E velocity: 76.70 cm/s  Systemic Diam: 2.00 cm MV A velocity: 73.70 cm/s MV E/A ratio:  1.04 Mihai Croitoru MD Electronically signed by Sanda Klein MD Signature Date/Time: 02/19/2020/5:55:40 PM    Final    IR PERCUTANEOUS ART THROMBECTOMY/INFUSION INTRACRANIAL INC DIAG ANGIO  Result Date: 02/20/2020 INDICATION: New onset right-sided weakness, right facial droop and incoordination. Occluded left posterior cerebral artery distal P1 P2 junction on CT angiogram of the head and neck. EXAM: 1. EMERGENT LARGE VESSEL OCCLUSION THROMBOLYSIS (POSTERIOR CIRCULATION) COMPARISON:  CT angiogram of the head and neck of February 18, 2020. MEDICATIONS: Ancef 2 g IV antibiotic was administered within 1 hour of the procedure. ANESTHESIA/SEDATION: General anesthesia CONTRAST:  Isovue 300 approximately 100 mL FLUOROSCOPY TIME:  Fluoroscopy Time: 42  minutes 42 seconds (2336 mGy). COMPLICATIONS: None immediate. TECHNIQUE: Following a full explanation of the procedure along with the potential associated complications, an informed witnessed consent was obtained from the patient. The risks of intracranial hemorrhage of 10%, worsening neurological deficit, ventilator dependency, death and inability to revascularize were all reviewed in detail with the patient. The patient was then put under general anesthesia by the Department of Anesthesiology at [facility]. The right groin was prepped and draped in the usual sterile fashion. Thereafter using modified Seldinger technique, transfemoral access into the right common femoral artery was obtained without difficulty. Over a 0.035 inch guidewire a 8 French 25 cm Pinnacle sheath was inserted. Through this, and also over a 0.035 inch guidewire a 5 [place] JB 1 catheter was advanced to the aortic arch region and selectively positioned in the left common carotid artery. FINDINGS: The left common carotid arteriogram just proximal to the bifurcation demonstrates left external carotid artery and its major branches to be widely patent. The left internal carotid artery appears abnormally prominent with mild tortuosity proximally. The left internal carotid artery opacifies to the cranial skull base. The petrous, the cavernous and the supraclinoid segments are widely patent. The left middle cerebral artery and the left anterior cerebral artery opacify into the capillary and venous phases. A prominent hypoglossal artery is seen arising from the medial aspect of the proximal left internal carotid artery with moderate tortuosity. This continues to the basilar artery, the right posterior cerebral artery, the superior  cerebral artery and the anterior-inferior cerebellar arteries bilaterally into the capillary and venous phases. The left posterior cerebral artery demonstrates complete occlusion at the P1 P2 junction. PROCEDURE: The diagnostic JB 1 catheter in the left common carotid artery was then exchanged over a 0.035 inch 300 cm Rosen exchange guidewire for a 95 cm 088 TracStar Zoom catheter which was positioned in the proximal left internal carotid artery. The guidewire was removed. Good aspiration was obtained from the TracStar guide catheter. A control arteriogram was then performed just proximal to the hypoglossal artery. Over a 0.014 inch standard Synchro micro guidewire with a J-tip configuration, an 021 Trevo ProVue microcatheter inside of a 132 cm 55 Zoom aspiration catheter combination was then advanced to the distal basilar artery. Using a torque device, the micro guidewire was then advanced into the left posterior cerebral artery and into the proximal P3 region. Further advancement of the micro guidewire was met with resistance. The microcatheter was then advanced to just the mid P3 segment. Guidewire was removed. Good aspiration obtained from the hub of the microcatheter. A gentle control arteriogram performed through the microcatheter demonstrated attenuated branches of the left posterior cerebral artery P3 region. This was then connected to continuous heparinized saline infusion. The 55 Zoom catheter was advanced into the proximal left P1 segment. A Tiger 17 retrieval device was then prepped and purged with heparinized saline infusion. This was then advanced in a coaxial manner and with constant heparinized saline infusion to the distal end of the microcatheter. The O ring on the delivery microcatheter was loosened. With slight forward gentle traction with right hand on the delivery micro guidewire, the microcatheter was retrieved unsheathing the retrieval device. Control clicking of the retrieval was then  performed expanding its diameter within the parent vessel. The massaging technique was then performed of the retrieval device. Aspiration was then continued through the aspiration catheter for approximately 2 minutes. Thereafter, the Tiger retrieval device was gently retrieved into the aspiration catheter and removed. A  small amount of clot was seen in the interstices of the Tiger 17 retrieval device. A control arteriogram performed through the aspiration catheter in the proximal left posterior cerebral artery demonstrated modest improvement in the caliber of the left posterior cerebral artery P2 P3 region. Further attempts with the [facility] retriever were not undertaken in view of the resistance to the advancement of the microcatheter more distally. A 35 160 cm aspiration was then advanced over a 0.014 inch standard Synchro micro guidewire through the 55 aspiration catheter. The micro guidewire was advanced through the occluded P2 segment without difficulty followed by the 35 aspiration catheter which was then imbedded into the occlusion. The guidewire was removed. Aspiration of with a Penumbra aspiration device was continued for approximately 2-1/2 minutes. Aspiration via a 60 mL syringe was also continued through the 55 aspiration catheter. The 35 aspiration catheter was retrieved and removed. The 55 aspiration catheter was retrieved more proximally. Aspiration through the 35 catheter was locked and was retrieved and removed. Free aspiration obtained from the hub of the 55 aspiration catheter. A control arteriogram performed through the 55 aspiration catheter demonstrated complete revascularization of the left posterior cerebral artery P2 and P3 segments. No intraluminal filling defects were noted. Moderate spasm responded to 25 mcg of nitroglycerin intra-arterially. A final control arteriogram performed through the 55 aspiration catheter in the proximal basilar artery demonstrated wide patency of the left posterior  cerebral artery achieving a TICI 3 revascularization. The remaining posterior circulation branches remain widely patent. A control arteriogram performed through the 8 [place] TracStar catheter in the left internal carotid approximately demonstrated wide patency of the left internal carotid artery intra and extra cranially and also of the intracranial vasculature. The TracStar was removed. The 8 French Pinnacle sheath was then replaced with an 8 French Angio-Seal closure device with hemostasis. The right dorsalis pedis and posterior tibial pulses were palpable. The left posterior tibial and dorsalis pedis branches were now Dopplerable. A CT scan of the brain obtained on the table demonstrated no evidence intracranial hemorrhage, mass effect or midline shift. The patient's general anesthesia was then reversed and the patient was then extubated without difficulty. The patient was moving her upper and lower extremities spontaneously. However, she complained of severe low back pain. Vital signs including the blood pressure remained stable. However, given the severe low back pain, a CT of the abdomen and pelvis was performed on an emergent basis which demonstrated no evidence of retroperitoneal hemorrhage or other significant abnormalities. This pain gradually subsided and the patient was transferred to neuro ICU for post thrombectomy management. 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 Status post endovascular complete revascularization of occluded left posterior cerebral artery distal P1 P2 junction with 1 pass with the Tiger 17 retrieval device, and 1 pass with 55 aspiration catheter with Penumbra aspiration achieving a TICI 3 revascularization. PLAN. Follow up with neurology Electronically Signed   By: Luanne Bras M.D.   On: 02/19/2020 11:51   CT HEAD CODE STROKE WO CONTRAST  Result Date:  02/18/2020 CLINICAL DATA:  Code stroke.  Neuro deficit EXAM: CT HEAD WITHOUT CONTRAST TECHNIQUE: Contiguous axial images were obtained from the base of the skull through the vertex without intravenous contrast. COMPARISON:  None. FINDINGS: Brain: No intracranial hemorrhage. Hypodense focus extending into the cortex within the left frontoparietal region is concerning for subacute insult (5:47). No mass lesion. No midline shift, ventriculomegaly or extra-axial fluid  collection. Vascular: No hyperdense vessel or unexpected calcification. Skull: Negative for fracture or focal lesion. Sinuses/Orbits: No acute finding. Other: None. ASPECTS [facility] Stroke Program Early CT Score) - Ganglionic level infarction (caudate, lentiform nuclei, internal capsule, insula, M1-M3 cortex): 6 - Supraganglionic infarction (M4-M6 cortex): 3 Total score (0-10 with 10 being normal): 9 IMPRESSION: 1. 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 2. ASPECTS is 9 Code stroke imaging results were communicated on 02/18/2020 at 12:23 pm to provider Dr. Milas Gain Via telephone, who verbally acknowledged these results. Electronically Signed   By: Primitivo Gauze M.D.   On: 02/18/2020 12:32   CT ANGIO HEAD CODE STROKE  Result Date: 02/18/2020 CLINICAL DATA:  Neuro deficit EXAM: CT ANGIOGRAPHY HEAD AND NECK TECHNIQUE: Multidetector CT imaging of the head and neck was performed using the standard protocol during bolus administration of intravenous contrast. Multiplanar CT image reconstructions and MIPs were obtained to evaluate the vascular anatomy. Carotid stenosis measurements (when applicable) are obtained utilizing NASCET criteria, using the distal internal carotid diameter as the denominator. CONTRAST:  60m OMNIPAQUE IOHEXOL 350 MG/ML SOLN COMPARISON:  Concurrent noncontrast head CT and CT cerebral perfusion. FINDINGS: CTA NECK FINDINGS Aortic arch: Standard branching. Imaged portion shows no evidence of aneurysm or dissection. No significant  stenosis of the major arch vessel origins. Right carotid system: No evidence of dissection, stenosis (50% or greater) or occlusion. Retropharyngeal course of the common and internal carotid arteries. Left carotid system: No evidence of dissection, stenosis (50% or greater) or occlusion. Retropharyngeal common carotid artery. Mild bifurcation atherosclerotic calcifications without significant luminal narrowing. Small web at the level of the bifurcation (6:27). Vertebral arteries: Codominant and diminutive. No evidence of dissection, stenosis (50% or greater) or occlusion. Skeleton: Multilevel spondylosis.  No acute osseous abnormality. Other neck: 2.4 x 1.7 cm right supraclavicular lesion, nonenhancing on arterial phase imaging (4:62). Bilateral thyroid hypoenhancing foci measuring up to 0.5 cm. No adenopathy by size criteria. Upper chest: Atelectasis. Review of the MIP images confirms the above findings CTA HEAD FINDINGS Anterior circulation: Normal caliber patent appearance of the bilateral internal, anterior and middle cerebral arteries. The left PCOM may be hypoplastic or absent. No significant stenosis, proximal occlusion, aneurysm, or vascular malformation. Posterior circulation: Diminutive, codominant vertebral arteries. Persistent left hypoglossal artery. Normal caliber patent basilar artery. Dominant right AICA. Proximally patent bilateral superior cerebellar arteries. Patent normal caliber right posterior cerebral artery. There is occlusion of the distal left P2 segment (3:119) with non opacification distally. Venous sinuses: As permitted by contrast timing, patent. Anatomic variants: Persistent left hypoglossal artery (4:201). Review of the MIP images confirms the above findings IMPRESSION: Distal left P2 segment occlusion. No large vessel occlusion or high-grade narrowing involving the anterior circulation. Persistent left hypoglossal artery terminating as the basilar. Diminutive vertebral arteries. Small  left carotid web at the level of the bifurcation. 2.4 cm right supraclavicular lesion is indeterminate. Outpatient workup with CT neck soft tissue with contrast is recommended. Code stroke imaging results were communicated on 02/18/2020 at 12:53 pm to provider Dr. SMilas GainVia telephone, who verbally acknowledged these results. Electronically Signed   By: CPrimitivo GauzeM.D.   On: 02/18/2020 13:21   CT ANGIO NECK CODE STROKE  Result Date: 02/18/2020 CLINICAL DATA:  Neuro deficit EXAM: CT ANGIOGRAPHY HEAD AND NECK TECHNIQUE: Multidetector CT imaging of the head and neck was performed using the standard protocol during bolus administration of intravenous contrast. Multiplanar CT image reconstructions and MIPs were obtained to evaluate the vascular anatomy. Carotid stenosis measurements (when applicable)  are obtained utilizing NASCET criteria, using the distal internal carotid diameter as the denominator. CONTRAST:  81m OMNIPAQUE IOHEXOL 350 MG/ML SOLN COMPARISON:  Concurrent noncontrast head CT and CT cerebral perfusion. FINDINGS: CTA NECK FINDINGS Aortic arch: Standard branching. Imaged portion shows no evidence of aneurysm or dissection. No significant stenosis of the major arch vessel origins. Right carotid system: No evidence of dissection, stenosis (50% or greater) or occlusion. Retropharyngeal course of the common and internal carotid arteries. Left carotid system: No evidence of dissection, stenosis (50% or greater) or occlusion. Retropharyngeal common carotid artery. Mild bifurcation atherosclerotic calcifications without significant luminal narrowing. Small web at the level of the bifurcation (6:27). Vertebral arteries: Codominant and diminutive. No evidence of dissection, stenosis (50% or greater) or occlusion. Skeleton: Multilevel spondylosis.  No acute osseous abnormality. Other neck: 2.4 x 1.7 cm right supraclavicular lesion, nonenhancing on arterial phase imaging (4:62). Bilateral thyroid  hypoenhancing foci measuring up to 0.5 cm. No adenopathy by size criteria. Upper chest: Atelectasis. Review of the MIP images confirms the above findings CTA HEAD FINDINGS Anterior circulation: Normal caliber patent appearance of the bilateral internal, anterior and middle cerebral arteries. The left PCOM may be hypoplastic or absent. No significant stenosis, proximal occlusion, aneurysm, or vascular malformation. Posterior circulation: Diminutive, codominant vertebral arteries. Persistent left hypoglossal artery. Normal caliber patent basilar artery. Dominant right AICA. Proximally patent bilateral superior cerebellar arteries. Patent normal caliber right posterior cerebral artery. There is occlusion of the distal left P2 segment (3:119) with non opacification distally. Venous sinuses: As permitted by contrast timing, patent. Anatomic variants: Persistent left hypoglossal artery (4:201). Review of the MIP images confirms the above findings IMPRESSION: Distal left P2 segment occlusion. No large vessel occlusion or high-grade narrowing involving the anterior circulation. Persistent left hypoglossal artery terminating as the basilar. Diminutive vertebral arteries. Small left carotid web at the level of the bifurcation. 2.4 cm right supraclavicular lesion is indeterminate. Outpatient workup with CT neck soft tissue with contrast is recommended. Code stroke imaging results were communicated on 02/18/2020 at 12:53 pm to provider Dr. SMilas GainVia telephone, who verbally acknowledged these results. Electronically Signed   By: CPrimitivo GauzeM.D.   On: 02/18/2020 13:21      HISTORY OF PRESENT ILLNESS 69y.o. female with no past medical history known.  Patient states that she was fine when she went to bed at approximately 12:00 last night.  When she woke up at 9 AM she noted that she was having severe difficulty walking as her right leg was weak.  She also tried to make some coffee with her right hand and dropped her  coffee.  Upon EMS arrival she was complaining of a severe headache.  And had a slight right facial droop.  Blood pressure on arrival was 140/60.  Blood glucose was 100.  Patient was immediately brought to CT scan.  During that period of time patient became very emotional, and obtaining a IV was very difficult.  Patient had no other complaints other than headache and right-sided weakness.  Due to patient being out of the window CT a of head and neck along with perfusion was also obtained.  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 ASPECTS 9   CTA head & neck distal L P2 occlusion. Small L ICA bifurcation web. 2.4cm R supraclavicular lesion indeterminate.  CT perfusion no core. L parietaloccipital penumbra 162mCerebral angio / IR  TICI3 revascularization of occluded Lt PCA P1/P2  junction w/ x 2 passes Tiger17 retriever and x 1 pass penumbra aspiration  Post IR CT No ICH  Or mas effect.  MRI  L PCA infarct (temporal lobe). 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  2D Echo pending  [facility] electrophysiologist will consult and consider placement of an implantable loop recorder to evaluate for atrial fibrillation as etiology of stroke. This has been explained to patient/family by Dr. Leonie Man and they are agreeable.  LDL 93 HgbA1c 5.3 VTE prophylaxis - Lovenox 40 mg sq daily added aspirin 325 mg daily prior to admission, now on No antithrombotic. Will add aspirin 81 and plavix 75 mg daily x 3 weeks then plavix alone   Therapy recommendations:  HH PT, HH OT  Disposition:  Return home - anticipate d/c tomorrow Transfer to medical tele today   Blood Pressure Home meds:  None, no hx HTN BP goal per IR x 24h following IR procedure  Post IR Permissive hypertension (OK if SBP < 180) but gradually normalize in 5-7 days Long-term BP goal normotensive    Hyperlipidemia Home meds:  No statin Add lipitor 40 LDL 93, goal < 70 Continue statin at discharge   Other Stroke Risk Factors Advanced age Cigarette smoker, advised to stop smoking On estrogen/premarin for HRT. Recommend she follow up with GYN for alternatives. She is hesitant to stop use.    Other Active Problems Post IR, pt s/o severe low back pain. Vital s stable.  CT abdomen and pelvis neg for retroperitoneal hemorrhage. No other sig findings. 2.4cm R supraclavicular lesion indeterminate. OP w/u recommended.  Loop recorder placed 02/20/20 RN Pressure Injury Documentation:     DISCHARGE EXAM Blood pressure 134/64, pulse 82, temperature 99.1 F (37.3 C), temperature source Oral, resp. rate 14, height _0  (1.676 m), weight 101.6 kg, SpO2 96 %. Pleasant elderly lady not in distress. . Afebrile. Head is nontraumatic. Neck is supple without bruit.    Cardiac exam no murmur or gallop. Lungs are clear to auscultation. Distal pulses are well felt. Neurological Exam ;  Awake  Alert oriented x 3. Normal speech and language.eye movements full without nystagmus.fundi were not visualized. Vision acuity   appears normal.  Right partial homonymous hemianopsia.  Hearing is normal. Palatal movements are normal. Face symmetric. Tongue midline. Normal strength, tone, reflexes and coordination except slight right upper and lower extremity drift and diminished fine finger movements on the right.  Orbits left over right upper extremity.  Mild weakness of right hip flexors and ankle dorsiflexors only.. Normal sensation. Gait deferred. Discharge Diet       Diet   Diet regular Room service appropriate? Yes; Fluid consistency: Thin   liquids  DISCHARGE PLAN Disposition:  Home with Home Health OT/ PT aspirin 81 mg daily and clopidogrel 75 mg daily for secondary stroke prevention for 3 weeks then plavix 99m alone. Ongoing stroke risk factor control by Primary Care Physician at time of  discharge Follow-up PCP Patient, No Pcp Per in 2 weeks. [facility], office to schedule an appointment.   45 minutes were spent preparing discharge.  JLaurey Morale MSN, NP-C [facility] [phone] have personally obtained history,examined this patient, reviewed notes, independently viewed imaging studies, participated in medical decision making and plan of care.ROS completed by me personally and pertinent positives fully documented  I have made any additions or clarifications directly to  the above note. Agree with note above.    Antony Contras, MD Medical Director [facility] Pager: [phone] 02/20/2020 4:44 PM

## 2020-02-20 NOTE — Progress Notes (Signed)
Discharge instructions given and IV access removed.  Stroke education provided and all questions answered.  Daughter notified of discharge and pt said guy came in room to tell her everything she needed to know about the loop recorder.

## 2020-02-20 NOTE — TOC Transition Note (Signed)
Transition of Care [facility]) - CM/SW Discharge Note   Patient Details  Name: Maria Green MRN: [id number] Date of Birth: January 24, 1951  Transition of Care [facility]) CM/SW Contact:  Kermit Balo, RN Phone Number: 02/20/2020, 12:11 PM   Clinical Narrative:    Pt discharging home with [facility] services through [place]. Cory with [facility] aware of d/c home today.  3 in 1 will be delivered to the room per [employer]. Pt will be staying with daughter who lives next door to pt.  Daughter to assist with medications at home and transportation.  Daughter providing transportation home today.   Final next level of care: Home w Home Health Services Barriers to Discharge: No Barriers Identified   Patient Goals and CMS Choice   CMS Medicare.gov Compare Post Acute Care list provided to:: Patient Choice offered to / list presented to : Patient  Discharge Placement                       Discharge Plan and Services                DME Arranged: 3-N-1 DME Agency: [employer] Date DME Agency Contacted: 02/20/20   Representative spoke with at DME Agency: Velna Hatchet in [place] HH Arranged: PT, OT HH Agency: [facility] Date [facility] Agency Contacted: 02/20/20   Representative spoke with at [facility] Agency: cory  Social Determinants of Health (SDOH) Interventions     Readmission Risk Interventions No flowsheet data found.

## 2020-02-20 NOTE — Progress Notes (Signed)
Occupational Therapy Treatment Patient Details Name: Maria Green MRN: [id number] DOB: 15-Jan-1951 Today's Date: 02/20/2020    History of present illness 69 y.o. female with no past medical history known.  Patient states that she was fine when she went to bed at approximately 12:00 last night.  When she woke up at 9 AM she noted that she was having severe difficulty walking as her right leg was weak.  She also tried to make some coffee with her right hand and dropped her coffee.  Upon EMS arrival she was complaining of a severe headache.  And had a slight right facial droop. 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   OT comments  Patient continues to make progress towards goals in skilled OT session. Patient's session encompassed education with regard to fall prevention, safety, and discharge home with daughter. Pt receptive to education and able to state appropriate problem solving for functional mobility and fall prevention. Discharge remains appropriate at this time; will continue to follow acutely.    Follow Up Recommendations  Home health OT;Supervision/Assistance - 24 hour    Equipment Recommendations  3 in 1 bedside commode    Recommendations for Other Services      Precautions / Restrictions Precautions Precautions: Fall Restrictions Weight Bearing Restrictions: No       Mobility Bed Mobility               General bed mobility comments: deferred  Transfers                 General transfer comment: deferred    Balance                                           ADL either performed or assessed with clinical judgement   ADL Overall ADL's : Needs assistance/impaired                                       General ADL Comments: Session focus on discharge education, fall prevention, and safety     Vision        Perception     Praxis      Cognition Arousal/Alertness: Awake/alert Behavior During Therapy: WFL for tasks assessed/performed Overall Cognitive Status: Within Functional Limits for tasks assessed                                          Exercises     Shoulder Instructions       General Comments      Pertinent Vitals/ Pain       Faces Pain Scale: Hurts a little bit Pain Location: soreness from loop placement Pain Descriptors / Indicators: Sore  Home Living                                          Prior Functioning/Environment              Frequency  Min 2X/week        Progress  Toward Goals  OT Goals(current goals can now be found in the care plan section)  Progress towards OT goals: Progressing toward goals  Acute Rehab OT Goals Patient Stated Goal: To return to independence OT Goal Formulation: With patient Time For Goal Achievement: 03/04/20 Potential to Achieve Goals: Good  Plan Discharge plan remains appropriate    Co-evaluation                 AM-PAC OT "6 Clicks" Daily Activity     Outcome Measure   Help from another person eating meals?: A Little Help from another person taking care of personal grooming?: A Little Help from another person toileting, which includes using toliet, bedpan, or urinal?: A Little Help from another person bathing (including washing, rinsing, drying)?: A Little Help from another person to put on and taking off regular upper body clothing?: A Little Help from another person to put on and taking off regular lower body clothing?: A Lot 6 Click Score: 17    End of Session    OT Visit Diagnosis: Other abnormalities of gait and mobility (R26.89);Muscle weakness (generalized) (M62.81);Pain;Low vision, both eyes (H54.2);Hemiplegia and hemiparesis Hemiplegia - Right/Left: Right Hemiplegia - dominant/non-dominant: Dominant Hemiplegia - caused by: Cerebral infarction Pain - part of  body: Arm (from loop placment)   Activity Tolerance Patient tolerated treatment well   Patient Left in bed;with call bell/phone within reach;with bed alarm set   Nurse Communication Mobility status        Time: 8466-5993 OT Time Calculation (min): 10 min  Charges: OT General Charges $OT Visit: 1 Visit OT Treatments $Self Care/Home Management : 8-22 mins  Pollyann Glen E. Hilma Steinhilber, COTA/L Acute Rehabilitation Services [phone]   Cherlyn Cushing 02/20/2020, 12:21 PM

## 2020-03-03 ENCOUNTER — Ambulatory Visit: Payer: Medicare Other

## 2020-03-26 ENCOUNTER — Ambulatory Visit (INDEPENDENT_AMBULATORY_CARE_PROVIDER_SITE_OTHER): Payer: Medicare Other

## 2020-03-26 DIAGNOSIS — I639 Cerebral infarction, unspecified: Secondary | ICD-10-CM | POA: Diagnosis not present

## 2020-03-27 LAB — CUP PACEART REMOTE DEVICE CHECK
Date Time Interrogation Session: 20211007103727
Implantable Pulse Generator Implant Date: 20210902

## 2020-03-30 NOTE — Progress Notes (Signed)
Carelink Summary Report / Loop Recorder 

## 2020-04-28 ENCOUNTER — Ambulatory Visit (INDEPENDENT_AMBULATORY_CARE_PROVIDER_SITE_OTHER): Payer: Medicare Other

## 2020-04-28 DIAGNOSIS — I639 Cerebral infarction, unspecified: Secondary | ICD-10-CM | POA: Diagnosis not present

## 2020-04-28 LAB — CUP PACEART REMOTE DEVICE CHECK
Date Time Interrogation Session: 20211109103449
Implantable Pulse Generator Implant Date: 20210902

## 2020-05-01 NOTE — Progress Notes (Signed)
Carelink Summary Report / Loop Recorder 

## 2020-05-26 ENCOUNTER — Other Ambulatory Visit: Payer: Self-pay

## 2020-05-26 NOTE — Patient Outreach (Signed)
[facility]) Care Management  05/26/2020  ANITTA TENNY 06-Apr-1951 [id number]   First telephone outreach attempt to obtain mRS. No answer. Left message for returned call.  Vanice Sarah [facility] Management Assistant [phone]

## 2020-05-26 NOTE — Patient Outreach (Signed)
[facility]) Care Management  05/26/2020  Maria Green [birth year] [id number]   Telephone outreach to patient to obtain mRS was successfully completed. MRS= 0.   Thank you, [facility] Care Management Assistant

## 2020-06-01 ENCOUNTER — Ambulatory Visit (INDEPENDENT_AMBULATORY_CARE_PROVIDER_SITE_OTHER): Payer: Medicare Other

## 2020-06-01 DIAGNOSIS — I639 Cerebral infarction, unspecified: Secondary | ICD-10-CM | POA: Diagnosis not present

## 2020-06-01 LAB — CUP PACEART REMOTE DEVICE CHECK
Date Time Interrogation Session: 20211212103111
Implantable Pulse Generator Implant Date: 20210902

## 2020-06-16 NOTE — Progress Notes (Signed)
Carelink Summary Report / Loop Recorder 

## 2020-07-06 ENCOUNTER — Ambulatory Visit (INDEPENDENT_AMBULATORY_CARE_PROVIDER_SITE_OTHER): Payer: Medicare Other

## 2020-07-06 DIAGNOSIS — I639 Cerebral infarction, unspecified: Secondary | ICD-10-CM | POA: Diagnosis not present

## 2020-07-08 LAB — CUP PACEART REMOTE DEVICE CHECK
Date Time Interrogation Session: 20220114103141
Implantable Pulse Generator Implant Date: 20210902

## 2020-07-20 NOTE — Progress Notes (Signed)
Carelink Summary Report / Loop Recorder 

## 2020-08-05 LAB — CUP PACEART REMOTE DEVICE CHECK
Date Time Interrogation Session: 20220216103039
Implantable Pulse Generator Implant Date: 20210902

## 2020-08-10 ENCOUNTER — Ambulatory Visit (INDEPENDENT_AMBULATORY_CARE_PROVIDER_SITE_OTHER): Payer: Medicare Other

## 2020-08-10 DIAGNOSIS — I639 Cerebral infarction, unspecified: Secondary | ICD-10-CM | POA: Diagnosis not present

## 2020-08-14 NOTE — Progress Notes (Signed)
Carelink Summary Report / Loop Recorder 

## 2020-09-13 LAB — CUP PACEART REMOTE DEVICE CHECK
Date Time Interrogation Session: 20220321102925
Implantable Pulse Generator Implant Date: 20210902

## 2020-09-14 ENCOUNTER — Ambulatory Visit (INDEPENDENT_AMBULATORY_CARE_PROVIDER_SITE_OTHER): Payer: Medicare Other

## 2020-09-14 DIAGNOSIS — I639 Cerebral infarction, unspecified: Secondary | ICD-10-CM

## 2020-09-28 NOTE — Progress Notes (Signed)
Carelink Summary Report / Loop Recorder 

## 2020-10-12 ENCOUNTER — Ambulatory Visit (INDEPENDENT_AMBULATORY_CARE_PROVIDER_SITE_OTHER): Payer: Medicare Other

## 2020-10-12 DIAGNOSIS — I639 Cerebral infarction, unspecified: Secondary | ICD-10-CM

## 2020-10-13 LAB — CUP PACEART REMOTE DEVICE CHECK
Date Time Interrogation Session: 20220423102954
Implantable Pulse Generator Implant Date: 20210902

## 2020-10-29 NOTE — Progress Notes (Signed)
Carelink Summary Report / Loop Recorder 

## 2020-11-04 ENCOUNTER — Encounter: Payer: Self-pay | Admitting: Cardiology

## 2020-11-04 DIAGNOSIS — I700 Atherosclerosis of aorta: Secondary | ICD-10-CM | POA: Insufficient documentation

## 2020-11-12 ENCOUNTER — Ambulatory Visit (INDEPENDENT_AMBULATORY_CARE_PROVIDER_SITE_OTHER): Payer: Medicare Other

## 2020-11-12 DIAGNOSIS — I639 Cerebral infarction, unspecified: Secondary | ICD-10-CM

## 2020-11-12 LAB — CUP PACEART REMOTE DEVICE CHECK
Date Time Interrogation Session: 20220526102941
Implantable Pulse Generator Implant Date: 20210902

## 2020-12-08 NOTE — Progress Notes (Signed)
Carelink Summary Report / Loop Recorder 

## 2020-12-14 ENCOUNTER — Encounter: Payer: Self-pay | Admitting: Cardiology

## 2020-12-14 ENCOUNTER — Ambulatory Visit (INDEPENDENT_AMBULATORY_CARE_PROVIDER_SITE_OTHER): Payer: Medicare Other | Admitting: Cardiology

## 2020-12-14 ENCOUNTER — Other Ambulatory Visit: Payer: Self-pay

## 2020-12-14 VITALS — BP 112/88 | HR 85 | Ht 66.0 in | Wt 223.6 lb

## 2020-12-14 DIAGNOSIS — I10 Essential (primary) hypertension: Secondary | ICD-10-CM | POA: Insufficient documentation

## 2020-12-14 DIAGNOSIS — E782 Mixed hyperlipidemia: Secondary | ICD-10-CM | POA: Diagnosis not present

## 2020-12-14 DIAGNOSIS — Z8673 Personal history of transient ischemic attack (TIA), and cerebral infarction without residual deficits: Secondary | ICD-10-CM | POA: Insufficient documentation

## 2020-12-14 DIAGNOSIS — E669 Obesity, unspecified: Secondary | ICD-10-CM | POA: Diagnosis not present

## 2020-12-14 DIAGNOSIS — Z6831 Body mass index (BMI) 31.0-31.9, adult: Secondary | ICD-10-CM | POA: Insufficient documentation

## 2020-12-14 MED ORDER — ATORVASTATIN CALCIUM 40 MG PO TABS
40.0000 mg | ORAL_TABLET | Freq: Every day | ORAL | 1 refills | Status: DC
Start: 2020-12-14 — End: 2023-01-02

## 2020-12-14 NOTE — Patient Instructions (Signed)
Medication Instructions:   Your physician recommends that you continue on your current medications as directed. Please refer to the Current Medication list given to you today.  *If you need a refill on your cardiac medications before your next appointment, please call your pharmacy*   Lab Work:  Your physician recommends that you return for lab work in:  TODAY: Lipids If you have labs (blood work) drawn today and your tests are completely normal, you will receive your results only by: MyChart Message (if you have MyChart) OR A paper copy in the mail If you have any lab test that is abnormal or we need to change your treatment, we will call you to review the results.   Testing/Procedures:  None   Follow-Up: At [facility], you and your health needs are our priority.  As part of our continuing mission to provide you with exceptional heart care, we have created designated Provider Care Teams.  These Care Teams include your primary Cardiologist (physician) and Advanced Practice Providers (APPs -  Physician Assistants and Nurse Practitioners) who all work together to provide you with the care you need, when you need it.  We recommend signing up for the patient portal called "MyChart".  Sign up information is provided on this After Visit Summary.  MyChart is used to connect with patients for Virtual Visits (Telemedicine).  Patients are able to view lab/test results, encounter notes, upcoming appointments, etc.  Non-urgent messages can be sent to your provider as well.   To learn more about what you can do with MyChart, go to [email or web address].    Your next appointment:   12 month(s)  The format for your next appointment:   In Person    Other Instructions   

## 2020-12-14 NOTE — Progress Notes (Signed)
Cardiology Office Note:    Date:  12/14/2020   ID:  Maria Green, DOB 12/23/1950, MRN [id number]  PCP:  Patient, No Pcp Per (Inactive)  Cardiologist:  Thomasene RippleKardie Tykee Heideman, DO  Electrophysiologist:  None   Referring MD: Crist FatVan Eyk, Jason, MD   No chief complaint on file. I am doing well  History of Present Illness:    Maria ClientCarlita M Husmann is a 70 y.o. female with a hx of history of hypertension, history of CVA s/p loop loop recorder placement in September 2021, most recent interrogation of the loop recorder no evidence of atrial fibrillation, Hyperlipidemia.  The patient tells me that she has been doing well from a cardiovascular standpoint.  Her loop recorder has been checked monthly and does not show up today any evidence of new A. fib episodes.  She denies any chest pain, shortness of breath, lightheaded dizziness.   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   Stroke (cerebrum) (HCC) 02/18/2020    Past Surgical History:  Procedure Laterality Date   IR CT HEAD LTD  02/18/2020   IR PERCUTANEOUS ART THROMBECTOMY/INFUSION INTRACRANIAL INC DIAG ANGIO  02/18/2020   LOOP RECORDER INSERTION N/A 02/20/2020   Procedure: LOOP RECORDER INSERTION;  Surgeon: Marinus Mawaylor, Gregg W, MD;  Location: [facility] INVASIVE CV LAB;  Service: Cardiovascular;  Laterality: N/A;   NEPHRECTOMY Right    RADICAL HYSTERECTOMY     RADIOLOGY WITH ANESTHESIA N/A 02/18/2020   Procedure: IR WITH ANESTHESIA;  Surgeon: Radiologist, Medication, MD;  Location: [facility] OR;  Service: Radiology;  Laterality: N/A;   WRIST SURGERY      Current Medications: Current Meds  Medication Sig   aspirin EC 81 MG EC tablet Take 1 tablet (81 mg total) by mouth daily. Swallow whole.   PARoxetine (PAXIL-CR) 12.5 MG 24 hr tablet Take 12.5 mg by mouth daily.     Allergies:   Tape and Bean pod extract   Social History    Socioeconomic History   Marital status: Widowed    Spouse name: Not on file   Number of children: Not on file   Years of education: Not on file   Highest education level: Not on file  Occupational History   Not on file  Tobacco Use   Smoking status: Some Days    Pack years: 0.00    Types: Cigarettes   Smokeless tobacco: Never   Tobacco comments:    12 years  Substance and Sexual Activity   Alcohol use: Not Currently   Drug use: Not Currently   Sexual activity: Not on file  Other Topics Concern   Not on file  Social History Narrative   Not on file   Social Determinants of Health   Financial Resource Strain: Not on file  Food Insecurity: Not on file  Transportation Needs: Not on file  Physical Activity: Not on file  Stress: Not on file  Social Connections: Not on file     Family History: The patient's family history includes Hypertension in her father and mother.  ROS:   Review of Systems  Constitution: Negative for decreased appetite, fever and weight gain.  HENT: Negative for congestion, ear discharge, hoarse voice and sore throat.   Eyes: Negative for discharge, redness, vision loss in right eye and visual halos.  Cardiovascular: Negative for chest pain, dyspnea on exertion, leg swelling, orthopnea  and palpitations.  Respiratory: Negative for cough, hemoptysis, shortness of breath and snoring.   Endocrine: Negative for heat intolerance and polyphagia.  Hematologic/Lymphatic: Negative for bleeding problem. Does not bruise/bleed easily.  Skin: Negative for flushing, nail changes, rash and suspicious lesions.  Musculoskeletal: Negative for arthritis, joint pain, muscle cramps, myalgias, neck pain and stiffness.  Gastrointestinal: Negative for abdominal pain, bowel incontinence, diarrhea and excessive appetite.  Genitourinary: Negative for decreased libido, genital sores and incomplete emptying.  Neurological: Negative for brief paralysis, focal weakness, headaches  and loss of balance.  Psychiatric/Behavioral: Negative for altered mental status, depression and suicidal ideas.  Allergic/Immunologic: Negative for HIV exposure and persistent infections.    EKGs/Labs/Other Studies Reviewed:    The following studies were reviewed today:   EKG:  none today  Transthoracic echocardiogram 02/2020 IMPRESSIONS   1. Left ventricular ejection fraction, by estimation, is 60 to 65%. The left ventricle has normal function. The left ventricle has no regional wall motion abnormalities. Left ventricular diastolic parameters were normal.   2. Right ventricular systolic function is normal. The right ventricular size is normal.   3. The mitral valve is normal in structure. No evidence of mitral valve regurgitation. No evidence of mitral stenosis.   4. The aortic valve is normal in structure. Aortic valve regurgitation is not visualized. No aortic stenosis is present.   5. The inferior vena cava is normal in size with greater than 50% respiratory variability, suggesting right atrial pressure of 3 mmHg.   Conclusion(s)/Recommendation(s): No intracardiac source of embolism detected on this transthoracic study. A transesophageal echocardiogram should be considered to exclude cardiac source of embolism if clinically indicated.   FINDINGS   Left Ventricle: Left ventricular ejection fraction, by estimation, is 60 to 65%. The left ventricle has normal function. The left ventricle has no  regional wall motion abnormalities. Definity contrast agent was given IV  to delineate the left ventricular   endocardial borders. The left ventricular internal cavity size was normal  in size. There is no left ventricular hypertrophy. Left ventricular  diastolic parameters were normal.   Right Ventricle: The right ventricular size is normal. No increase in  right ventricular wall thickness. Right ventricular systolic function is  normal.   Left Atrium: Left atrial size was normal in size.    Right Atrium: Right atrial size was normal in size.   Pericardium: There is no evidence of pericardial effusion.   Mitral Valve: The mitral valve is normal in structure. Normal mobility of  the mitral valve leaflets. No evidence of mitral valve regurgitation. No  evidence of mitral valve stenosis.   Tricuspid Valve: The tricuspid valve is normal in structure. Tricuspid  valve regurgitation is not demonstrated. No evidence of tricuspid  stenosis.   Aortic Valve: The aortic valve is normal in structure. Aortic valve  regurgitation is not visualized. No aortic stenosis is present.   Pulmonic Valve: The pulmonic valve was normal in structure. Pulmonic valve  regurgitation is not visualized. No evidence of pulmonic stenosis.   Aorta: The aortic root is normal in size and structure.   Venous: The inferior vena cava is normal in size with greater than 50%  respiratory variability, suggesting right atrial pressure of 3 mmHg.   IAS/Shunts: No atrial level shunt detected by color flow Doppler.   Recent Labs: 02/18/2020: ALT 12 02/19/2020: BUN 8; Creatinine, Ser 0.66; Hemoglobin 13.0; Platelets 292; Potassium 4.2; Sodium 140  Recent Lipid Panel    Component Value Date/Time   CHOL 186  02/19/2020 0511   TRIG 142 02/19/2020 0511   HDL 65 02/19/2020 0511   CHOLHDL 2.9 02/19/2020 0511   VLDL 28 02/19/2020 0511   LDLCALC 93 02/19/2020 0511    Physical Exam:    VS:  BP 112/88   Pulse 85   Ht 5\' 6"  (1.676 m)   Wt 223 lb 9.6 oz (101.4 kg)   SpO2 95%   BMI 36.09 kg/m     Wt Readings from Last 3 Encounters:  12/14/20 223 lb 9.6 oz (101.4 kg)  02/19/20 223 lb 15.8 oz (101.6 kg)     GEN: Well nourished, well developed in no acute distress HEENT: Normal NECK: No JVD; No carotid bruits LYMPHATICS: No lymphadenopathy CARDIAC: S1S2 noted,RRR, no murmurs, rubs, gallops RESPIRATORY:  Clear to auscultation without rales, wheezing or rhonchi  ABDOMEN: Soft, non-tender, non-distended,  +bowel sounds, no guarding. EXTREMITIES: No edema, No cyanosis, no clubbing MUSCULOSKELETAL:  No deformity  SKIN: Warm and dry NEUROLOGIC:  Alert and oriented x 3, non-focal PSYCHIATRIC:  Normal affect, good insight  ASSESSMENT:    1. History of CVA (cerebrovascular accident)   2. Hypertension, unspecified type   3. Mixed hyperlipidemia   4. Obesity (BMI 30-39.9)    PLAN:    1.  Spoke with the patient and she is on aspirin 81 mg daily.  She needs to be restarted on atorvastatin 40 mg daily which we will going to send refills for today.  She is planning to see neurology in a couple weeks I will defer to them for restart of her Plavix.  2.  Her echocardiogram from a year ago does not show any abnormalities there is no clinical symptoms changed there is no need for any repeat study per  3.  Her blood pressure is acceptable  4.  The patient understands the need to lose weight with diet and exercise. We have discussed specific strategies for this.  The patient is in agreement with the above plan. The patient left the office in stable condition.  The patient will follow up in in 1 year.   Medication Adjustments/Labs and Tests Ordered: Current medicines are reviewed at length with the patient today.  Concerns regarding medicines are outlined above.  No orders of the defined types were placed in this encounter.  No orders of the defined types were placed in this encounter.   There are no Patient Instructions on file for this visit.   Adopting a Healthy Lifestyle.  Know what a healthy weight is for you (roughly BMI <25) and aim to maintain this   Aim for 7+ servings of fruits and vegetables daily   65-80+ fluid ounces of water or unsweet tea for healthy kidneys   Limit to max 1 drink of alcohol per day; avoid smoking/tobacco   Limit animal fats in diet for cholesterol and heart health - choose grass fed whenever available   Avoid highly processed foods, and foods high in  saturated/trans fats   Aim for low stress - take time to unwind and care for your mental health   Aim for 150 min of moderate intensity exercise weekly for heart health, and weights twice weekly for bone health   Aim for 7-9 hours of sleep daily   When it comes to diets, agreement about the perfect plan isnt easy to find, even among the experts. Experts at the [facility] of [facility] developed an idea known as the Healthy Eating Plate. Just imagine a plate divided into logical, healthy  portions.   The emphasis is on diet quality:   Load up on vegetables and fruits - one-half of your plate: Aim for color and variety, and remember that potatoes dont count.   Go for whole grains - one-quarter of your plate: Whole wheat, barley, wheat berries, quinoa, oats, brown rice, and foods made with them. If you want pasta, go with whole wheat pasta.   Protein power - one-quarter of your plate: Fish, chicken, beans, and nuts are all healthy, versatile protein sources. Limit red meat.   The diet, however, does go beyond the plate, offering a few other suggestions.   Use healthy plant oils, such as olive, canola, soy, corn, sunflower and peanut. Check the labels, and avoid partially hydrogenated oil, which have unhealthy trans fats.   If youre thirsty, drink water. Coffee and tea are good in moderation, but skip sugary drinks and limit milk and dairy products to one or two daily servings.   The type of carbohydrate in the diet is more important than the amount. Some sources of carbohydrates, such as vegetables, fruits, whole grains, and beans-are healthier than others.   Finally, stay active  Signed, Thomasene Ripple, DO  12/14/2020 11:25 AM    [facility]

## 2020-12-15 ENCOUNTER — Ambulatory Visit (INDEPENDENT_AMBULATORY_CARE_PROVIDER_SITE_OTHER): Payer: Medicare Other

## 2020-12-15 DIAGNOSIS — I639 Cerebral infarction, unspecified: Secondary | ICD-10-CM | POA: Diagnosis not present

## 2020-12-15 LAB — LIPID PANEL
Chol/HDL Ratio: 2.8 ratio (ref 0.0–4.4)
Cholesterol, Total: 212 mg/dL — ABNORMAL HIGH (ref 100–199)
HDL: 76 mg/dL
LDL Chol Calc (NIH): 116 mg/dL — ABNORMAL HIGH (ref 0–99)
Triglycerides: 113 mg/dL (ref 0–149)
VLDL Cholesterol Cal: 20 mg/dL (ref 5–40)

## 2020-12-15 LAB — CUP PACEART REMOTE DEVICE CHECK
Date Time Interrogation Session: 20220628103124
Implantable Pulse Generator Implant Date: 20210902

## 2020-12-18 ENCOUNTER — Telehealth: Payer: Self-pay

## 2020-12-18 NOTE — Telephone Encounter (Signed)
Pt had a missed phone call from our office on her hone.  She does not know who was trying to call her and no message was left.  Advised I do not see any outgoing messages. If someone needs to reach her or has a message for her they will call back.

## 2021-01-05 NOTE — Progress Notes (Signed)
Carelink Summary Report / Loop Recorder 

## 2021-01-20 ENCOUNTER — Encounter: Payer: Self-pay | Admitting: *Deleted

## 2021-01-21 LAB — CUP PACEART REMOTE DEVICE CHECK
Date Time Interrogation Session: 20220731103342
Implantable Pulse Generator Implant Date: 20210902

## 2021-01-25 ENCOUNTER — Ambulatory Visit (INDEPENDENT_AMBULATORY_CARE_PROVIDER_SITE_OTHER): Payer: Medicare Other

## 2021-01-25 DIAGNOSIS — I639 Cerebral infarction, unspecified: Secondary | ICD-10-CM | POA: Diagnosis not present

## 2021-01-26 ENCOUNTER — Ambulatory Visit: Payer: Medicare Other | Admitting: Diagnostic Neuroimaging

## 2021-01-26 ENCOUNTER — Encounter: Payer: Self-pay | Admitting: Diagnostic Neuroimaging

## 2021-02-06 IMAGING — MR MR HEAD W/O CM
10 of 11 series · 43 of 48 positions shown · non-contrast
Comparison: None.

CLINICAL DATA: Right lower extremity weakness.  Headache.

EXAM:
MRI HEAD WITHOUT CONTRAST
TECHNIQUE: Multiplanar, multiecho pulse sequences of the brain and surrounding
structures were obtained without intravenous contrast.

[Series 5: DWI · axial · 3.0mm · 0.88mm/px · z∈[-72,+74]mm · 10 of 100 slices shown (1 of 4)]
[im 1/100]
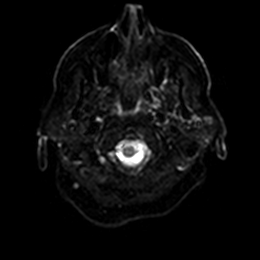
[im 12/100]
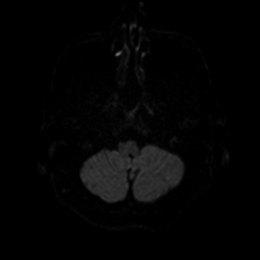
[im 23/100]
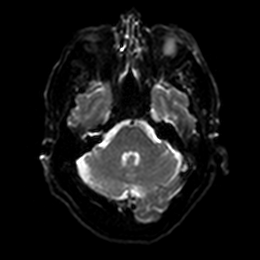
[im 34/100]
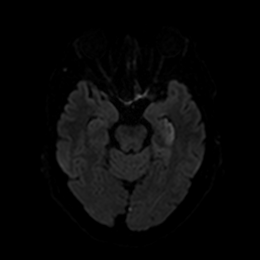
[im 45/100]
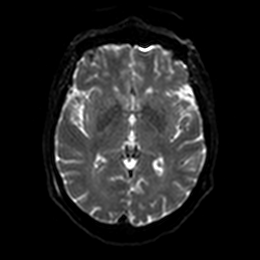
[im 56/100]
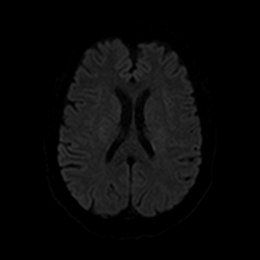
[im 67/100]
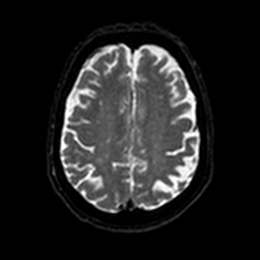
[im 78/100]
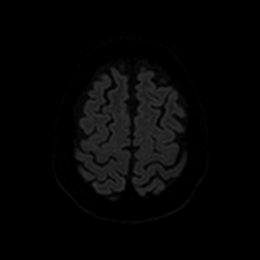
[im 89/100]
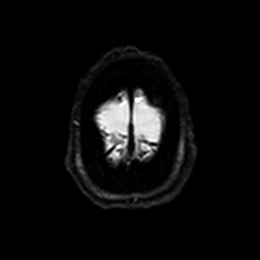
[im 100/100]
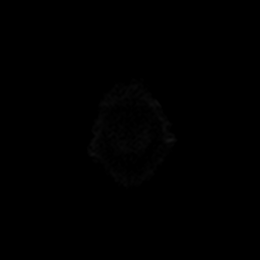

[Series 6: DWI · axial · 3.0mm · 0.88mm/px · z∈[-72,+74]mm · 5 of 50 slices shown (2 of 4)]
[im 1/50]
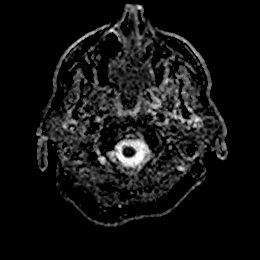
[im 13/50]
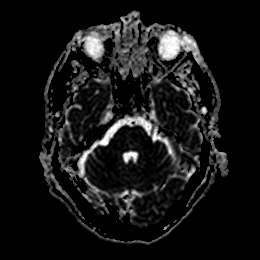
[im 25/50]
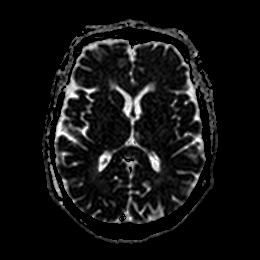
[im 37/50]
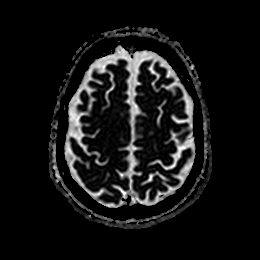
[im 50/50]
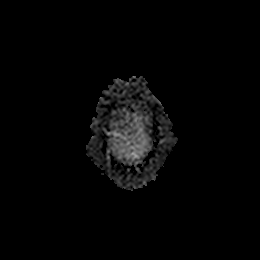

[Series 7: DWI · coronal · 4.0mm · 0.88mm/px · 6 of 66 slices shown (3 of 4)]
[im 1/66]
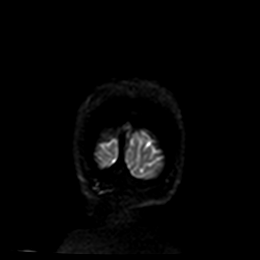
[im 14/66]
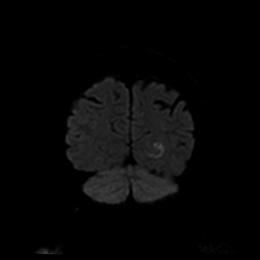
[im 27/66]
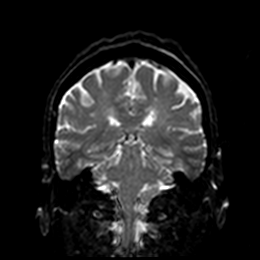
[im 40/66]
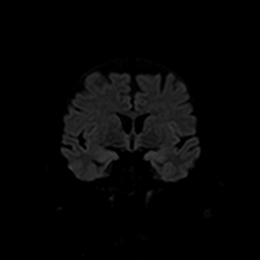
[im 53/66]
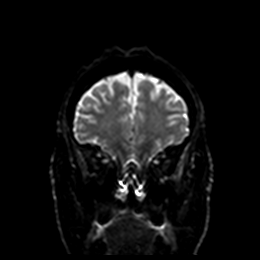
[im 66/66]
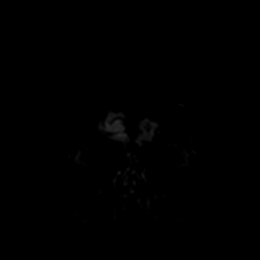

[Series 8: DWI · coronal · 4.0mm · 0.88mm/px · 3 of 33 slices shown (4 of 4)]
[im 1/33]
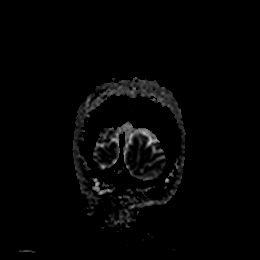
[im 17/33]
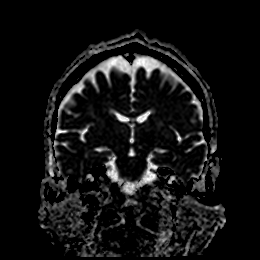
[im 33/33]
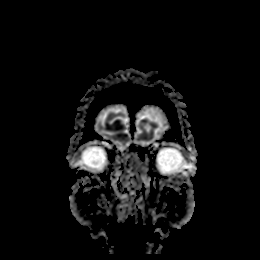

[Series 9: T1 · sagittal · 5.0mm · 0.75mm/px · 2 of 23 slices shown]
[im 1/23]
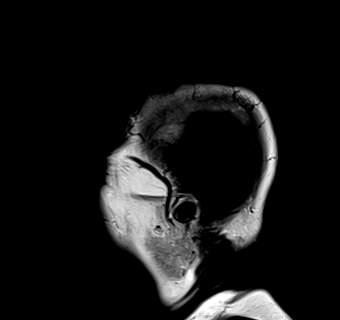
[im 23/23]
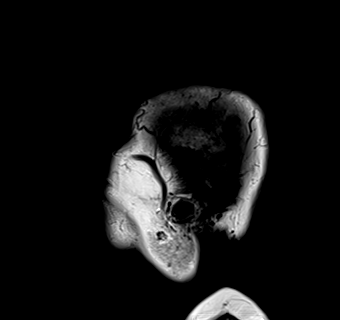

[Series 10: T2 · axial · 5.0mm · 0.72mm/px · z∈[-70,+72]mm · 2 of 25 slices shown (1 of 2)]
[im 1/25]
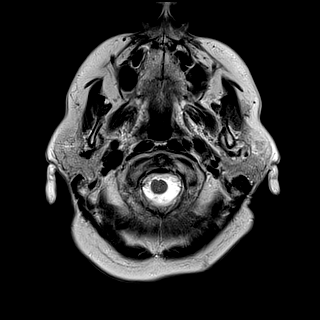
[im 25/25]
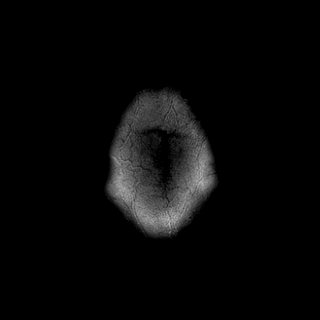

[Series 11: FLAIR · axial · 5.0mm · 0.45mm/px · z∈[-69,+73]mm · 2 of 25 slices shown]
[im 1/25]
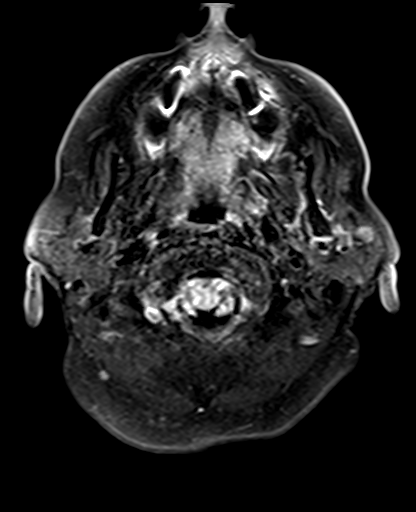
[im 25/25]
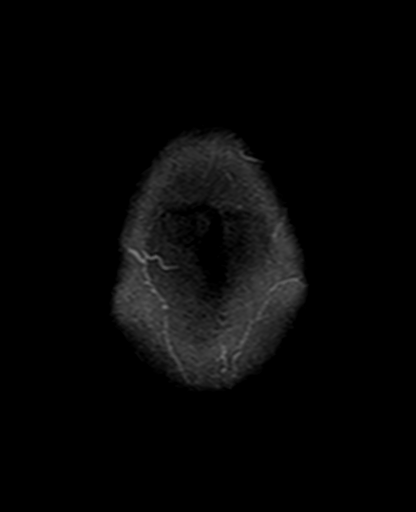

[Series 13: pha_images · axial · 3.0mm · 0.90mm/px · z∈[-86,+87]mm · 5 of 58 slices shown]
[im 1/58]
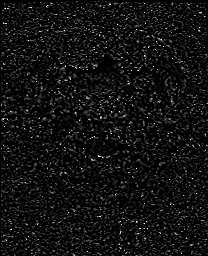
[im 15/58]
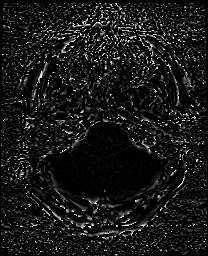
[im 29/58]
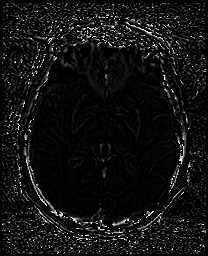
[im 43/58]
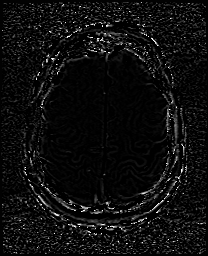
[im 58/58]
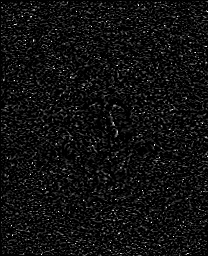

[Series 14: swi_images · axial · 3.0mm · 0.90mm/px · z∈[-86,+90]mm · 5 of 60 slices shown]
[im 1/60]
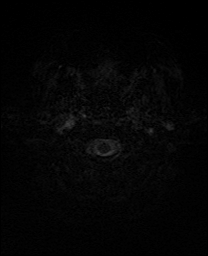
[im 15/60]
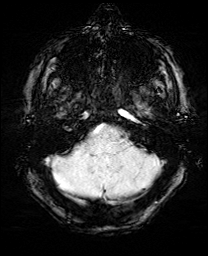
[im 30/60]
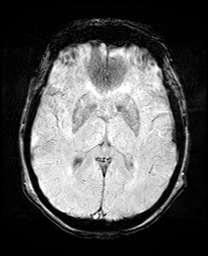
[im 45/60]
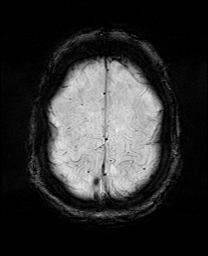
[im 60/60]
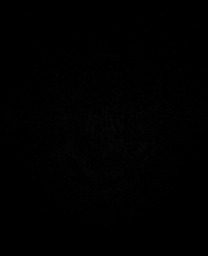

[Series 17: T2 · coronal · 5.0mm · 0.34mm/px · 3 of 29 slices shown (2 of 2)]
[im 1/29]
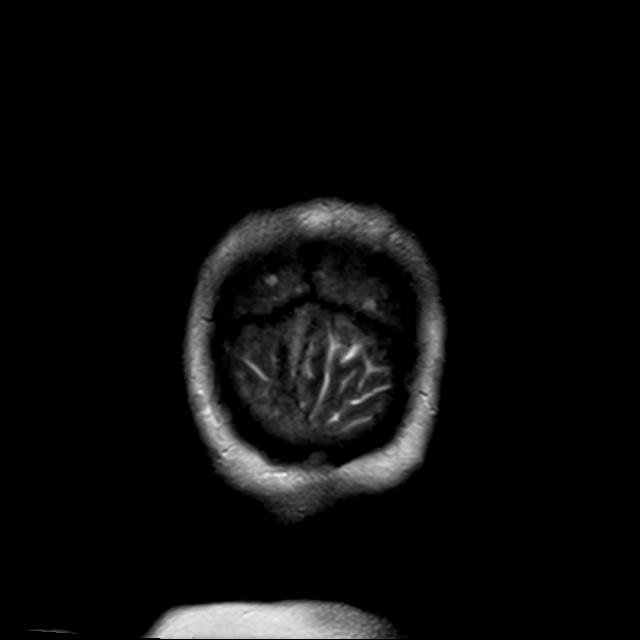
[im 15/29]
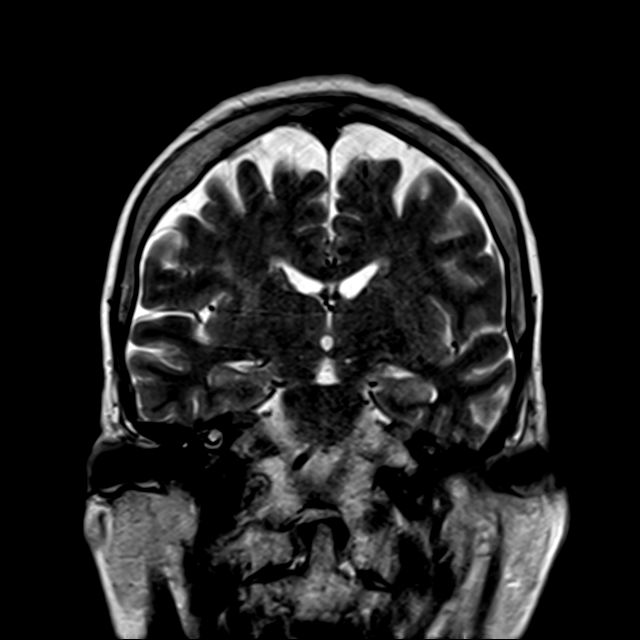
[im 29/29]
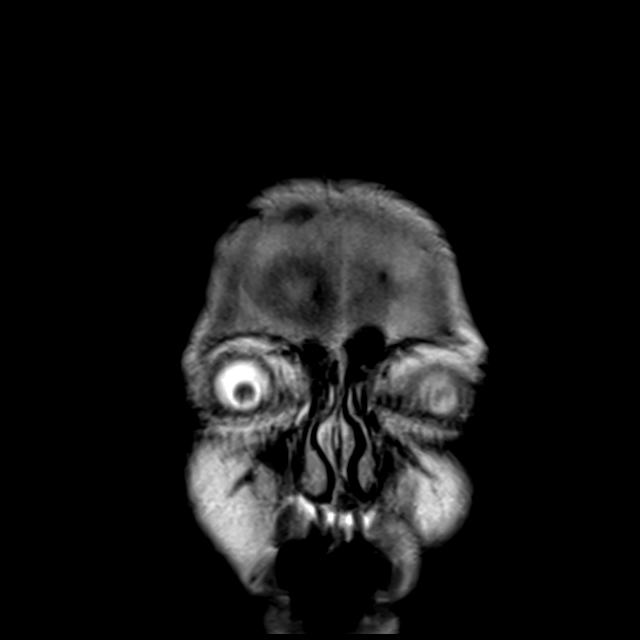

[43 of 48 positions shown; findings below may reference images not displayed]

FINDINGS: Brain: There is abnormal diffusion restriction within the medial
left temporal lobe. Punctate focus of diffusion restriction at the
superior aspect of the left thalamus. Old bilateral cerebellar
infarcts. Multifocal hyperintense T2-weighted signal within the
white matter. Normal volume of CSF spaces. No chronic
microhemorrhage. Normal midline structures.

Vascular: Normal flow voids.

Skull and upper cervical spine: Normal marrow signal.

Sinuses/Orbits: Negative.

Other: None.
IMPRESSION: 1. Multifocal acute ischemia within the left PCA territory,
predominantly in the medial left temporal lobe. No hemorrhage or
mass effect.
2. Old bilateral cerebellar infarcts and findings of chronic
microvascular disease.

## 2021-02-18 NOTE — Progress Notes (Signed)
Carelink Summary Report / Loop Recorder 

## 2021-02-23 ENCOUNTER — Ambulatory Visit (INDEPENDENT_AMBULATORY_CARE_PROVIDER_SITE_OTHER): Payer: Medicare Other

## 2021-02-23 DIAGNOSIS — I639 Cerebral infarction, unspecified: Secondary | ICD-10-CM | POA: Diagnosis not present

## 2021-02-24 LAB — CUP PACEART REMOTE DEVICE CHECK
Date Time Interrogation Session: 20220902102936
Implantable Pulse Generator Implant Date: 20210902

## 2021-03-04 NOTE — Progress Notes (Signed)
Carelink Summary Report / Loop Recorder 

## 2021-03-25 LAB — CUP PACEART REMOTE DEVICE CHECK
Date Time Interrogation Session: 20221005103415
Implantable Pulse Generator Implant Date: 20210902

## 2021-03-29 ENCOUNTER — Ambulatory Visit (INDEPENDENT_AMBULATORY_CARE_PROVIDER_SITE_OTHER): Payer: Medicare Other

## 2021-03-29 DIAGNOSIS — I639 Cerebral infarction, unspecified: Secondary | ICD-10-CM

## 2021-04-07 NOTE — Progress Notes (Signed)
Carelink Summary Report / Loop Recorder 

## 2021-04-29 LAB — CUP PACEART REMOTE DEVICE CHECK
Date Time Interrogation Session: 20221107102931
Implantable Pulse Generator Implant Date: 20210902

## 2021-05-03 ENCOUNTER — Ambulatory Visit (INDEPENDENT_AMBULATORY_CARE_PROVIDER_SITE_OTHER): Payer: Medicare Other

## 2021-05-03 DIAGNOSIS — I639 Cerebral infarction, unspecified: Secondary | ICD-10-CM | POA: Diagnosis not present

## 2021-05-10 NOTE — Progress Notes (Signed)
Carelink Summary Report / Loop Recorder 

## 2021-06-02 LAB — CUP PACEART REMOTE DEVICE CHECK
Date Time Interrogation Session: 20221210103048
Implantable Pulse Generator Implant Date: 20210902

## 2021-06-07 ENCOUNTER — Ambulatory Visit (INDEPENDENT_AMBULATORY_CARE_PROVIDER_SITE_OTHER): Payer: Medicare Other

## 2021-06-07 DIAGNOSIS — I639 Cerebral infarction, unspecified: Secondary | ICD-10-CM | POA: Diagnosis not present

## 2021-06-17 NOTE — Progress Notes (Signed)
Carelink Summary Report / Loop Recorder 

## 2021-07-12 ENCOUNTER — Ambulatory Visit (INDEPENDENT_AMBULATORY_CARE_PROVIDER_SITE_OTHER): Payer: Medicare Other

## 2021-07-12 DIAGNOSIS — I639 Cerebral infarction, unspecified: Secondary | ICD-10-CM | POA: Diagnosis not present

## 2021-07-12 LAB — CUP PACEART REMOTE DEVICE CHECK
Date Time Interrogation Session: 20230122230109
Implantable Pulse Generator Implant Date: 20210902

## 2021-07-23 NOTE — Progress Notes (Signed)
Carelink Summary Report / Loop Recorder 

## 2021-08-14 LAB — CUP PACEART REMOTE DEVICE CHECK
Date Time Interrogation Session: 20230224230538
Implantable Pulse Generator Implant Date: 20210902

## 2021-08-16 ENCOUNTER — Ambulatory Visit (INDEPENDENT_AMBULATORY_CARE_PROVIDER_SITE_OTHER): Payer: Medicare Other

## 2021-08-16 DIAGNOSIS — I639 Cerebral infarction, unspecified: Secondary | ICD-10-CM | POA: Diagnosis not present

## 2021-08-23 NOTE — Progress Notes (Signed)
Carelink Summary Report / Loop Recorder 

## 2021-09-20 ENCOUNTER — Ambulatory Visit (INDEPENDENT_AMBULATORY_CARE_PROVIDER_SITE_OTHER): Payer: Medicare Other

## 2021-09-20 DIAGNOSIS — I639 Cerebral infarction, unspecified: Secondary | ICD-10-CM

## 2021-09-21 LAB — CUP PACEART REMOTE DEVICE CHECK
Date Time Interrogation Session: 20230331230522
Implantable Pulse Generator Implant Date: 20210902

## 2021-10-05 NOTE — Progress Notes (Signed)
Carelink Summary Report / Loop Recorder 

## 2021-10-21 LAB — CUP PACEART REMOTE DEVICE CHECK
Date Time Interrogation Session: 20230503230753
Implantable Pulse Generator Implant Date: 20210902

## 2021-10-25 ENCOUNTER — Ambulatory Visit (INDEPENDENT_AMBULATORY_CARE_PROVIDER_SITE_OTHER): Payer: Medicare Other

## 2021-10-25 DIAGNOSIS — I639 Cerebral infarction, unspecified: Secondary | ICD-10-CM

## 2021-11-10 NOTE — Progress Notes (Signed)
Carelink Summary Report / Loop Recorder 

## 2021-11-29 ENCOUNTER — Ambulatory Visit: Payer: Medicare Other

## 2021-11-29 DIAGNOSIS — I639 Cerebral infarction, unspecified: Secondary | ICD-10-CM

## 2021-12-01 LAB — CUP PACEART REMOTE DEVICE CHECK
Date Time Interrogation Session: 20230605230621
Implantable Pulse Generator Implant Date: 20210902

## 2021-12-30 LAB — CUP PACEART REMOTE DEVICE CHECK
Date Time Interrogation Session: 20230708230617
Implantable Pulse Generator Implant Date: 20210902

## 2022-01-03 ENCOUNTER — Ambulatory Visit (INDEPENDENT_AMBULATORY_CARE_PROVIDER_SITE_OTHER): Payer: Medicare Other

## 2022-01-03 DIAGNOSIS — I639 Cerebral infarction, unspecified: Secondary | ICD-10-CM | POA: Diagnosis not present

## 2022-02-02 NOTE — Progress Notes (Signed)
Carelink Summary Report / Loop Recorder 

## 2022-02-07 ENCOUNTER — Ambulatory Visit (INDEPENDENT_AMBULATORY_CARE_PROVIDER_SITE_OTHER): Payer: Medicare Other

## 2022-02-07 DIAGNOSIS — I639 Cerebral infarction, unspecified: Secondary | ICD-10-CM | POA: Diagnosis not present

## 2022-02-08 LAB — CUP PACEART REMOTE DEVICE CHECK
Date Time Interrogation Session: 20230820230413
Implantable Pulse Generator Implant Date: 20210902

## 2022-03-07 NOTE — Progress Notes (Signed)
Carelink Summary Report / Loop Recorder 

## 2022-03-14 ENCOUNTER — Ambulatory Visit (INDEPENDENT_AMBULATORY_CARE_PROVIDER_SITE_OTHER): Payer: Medicare HMO

## 2022-03-14 DIAGNOSIS — I639 Cerebral infarction, unspecified: Secondary | ICD-10-CM | POA: Diagnosis not present

## 2022-03-15 LAB — CUP PACEART REMOTE DEVICE CHECK
Date Time Interrogation Session: 20230922230346
Implantable Pulse Generator Implant Date: 20210902

## 2022-03-30 NOTE — Progress Notes (Signed)
Carelink Summary Report / Loop Recorder 

## 2022-04-05 DIAGNOSIS — I700 Atherosclerosis of aorta: Secondary | ICD-10-CM | POA: Diagnosis not present

## 2022-04-05 DIAGNOSIS — J0110 Acute frontal sinusitis, unspecified: Secondary | ICD-10-CM | POA: Diagnosis not present

## 2022-04-05 DIAGNOSIS — E559 Vitamin D deficiency, unspecified: Secondary | ICD-10-CM | POA: Diagnosis not present

## 2022-04-05 DIAGNOSIS — K5904 Chronic idiopathic constipation: Secondary | ICD-10-CM | POA: Diagnosis not present

## 2022-04-05 DIAGNOSIS — M8580 Other specified disorders of bone density and structure, unspecified site: Secondary | ICD-10-CM | POA: Diagnosis not present

## 2022-04-05 DIAGNOSIS — Z8673 Personal history of transient ischemic attack (TIA), and cerebral infarction without residual deficits: Secondary | ICD-10-CM | POA: Diagnosis not present

## 2022-04-18 ENCOUNTER — Ambulatory Visit (INDEPENDENT_AMBULATORY_CARE_PROVIDER_SITE_OTHER): Payer: Medicare HMO

## 2022-04-18 DIAGNOSIS — I639 Cerebral infarction, unspecified: Secondary | ICD-10-CM | POA: Diagnosis not present

## 2022-04-18 LAB — CUP PACEART REMOTE DEVICE CHECK
Date Time Interrogation Session: 20231025230351
Implantable Pulse Generator Implant Date: 20210902

## 2022-05-21 NOTE — Progress Notes (Signed)
Carelink Summary Report / Loop Recorder 

## 2022-05-23 ENCOUNTER — Ambulatory Visit (INDEPENDENT_AMBULATORY_CARE_PROVIDER_SITE_OTHER): Payer: Medicare Other

## 2022-05-23 DIAGNOSIS — I639 Cerebral infarction, unspecified: Secondary | ICD-10-CM

## 2022-05-24 LAB — CUP PACEART REMOTE DEVICE CHECK
Date Time Interrogation Session: 20231203230758
Implantable Pulse Generator Implant Date: 20210902

## 2022-06-27 ENCOUNTER — Ambulatory Visit (INDEPENDENT_AMBULATORY_CARE_PROVIDER_SITE_OTHER): Payer: Medicare Other

## 2022-06-27 DIAGNOSIS — I639 Cerebral infarction, unspecified: Secondary | ICD-10-CM

## 2022-06-28 LAB — CUP PACEART REMOTE DEVICE CHECK
Date Time Interrogation Session: 20240107231042
Implantable Pulse Generator Implant Date: 20210902

## 2022-07-01 NOTE — Progress Notes (Signed)
Carelink Summary Report / Loop Recorder

## 2022-07-04 ENCOUNTER — Ambulatory Visit: Payer: Medicare HMO | Admitting: Internal Medicine

## 2022-08-01 ENCOUNTER — Ambulatory Visit: Payer: Medicare HMO

## 2022-08-01 DIAGNOSIS — I639 Cerebral infarction, unspecified: Secondary | ICD-10-CM

## 2022-08-02 LAB — CUP PACEART REMOTE DEVICE CHECK
Date Time Interrogation Session: 20240209230526
Implantable Pulse Generator Implant Date: 20210902

## 2022-08-08 NOTE — Progress Notes (Signed)
Carelink Summary Report / Loop Recorder 

## 2022-09-05 ENCOUNTER — Ambulatory Visit (INDEPENDENT_AMBULATORY_CARE_PROVIDER_SITE_OTHER): Payer: Medicare HMO

## 2022-09-05 DIAGNOSIS — I639 Cerebral infarction, unspecified: Secondary | ICD-10-CM

## 2022-09-06 LAB — CUP PACEART REMOTE DEVICE CHECK
Date Time Interrogation Session: 20240317230554
Implantable Pulse Generator Implant Date: 20210902

## 2022-09-15 NOTE — Progress Notes (Signed)
Carelink Summary Report / Loop Recorder 

## 2022-10-10 ENCOUNTER — Ambulatory Visit (INDEPENDENT_AMBULATORY_CARE_PROVIDER_SITE_OTHER): Payer: Medicare Other

## 2022-10-10 DIAGNOSIS — I639 Cerebral infarction, unspecified: Secondary | ICD-10-CM

## 2022-10-11 DIAGNOSIS — S61411A Laceration without foreign body of right hand, initial encounter: Secondary | ICD-10-CM | POA: Diagnosis not present

## 2022-10-11 DIAGNOSIS — Z23 Encounter for immunization: Secondary | ICD-10-CM | POA: Diagnosis not present

## 2022-10-11 DIAGNOSIS — W458XXA Other foreign body or object entering through skin, initial encounter: Secondary | ICD-10-CM | POA: Diagnosis not present

## 2022-10-11 LAB — CUP PACEART REMOTE DEVICE CHECK
Date Time Interrogation Session: 20240419230335
Implantable Pulse Generator Implant Date: 20210902

## 2022-10-18 NOTE — Progress Notes (Signed)
Carelink Summary Report / Loop Recorder 

## 2022-10-24 ENCOUNTER — Ambulatory Visit: Payer: Medicare HMO | Admitting: Internal Medicine

## 2022-10-24 ENCOUNTER — Encounter: Payer: Self-pay | Admitting: Internal Medicine

## 2022-10-24 VITALS — BP 112/80 | HR 71 | Temp 97.3°F | Resp 18 | Ht 66.0 in | Wt 199.4 lb

## 2022-10-24 DIAGNOSIS — S61411A Laceration without foreign body of right hand, initial encounter: Secondary | ICD-10-CM | POA: Insufficient documentation

## 2022-10-24 DIAGNOSIS — S61411D Laceration without foreign body of right hand, subsequent encounter: Secondary | ICD-10-CM

## 2022-10-24 MED ORDER — PREMARIN 0.625 MG PO TABS: 0.6250 mg | ORAL_TABLET | Freq: Every day | ORAL | 6 refills | Status: DC

## 2022-10-24 NOTE — Progress Notes (Signed)
She is here for stitch to be removed from right hand between thumb and index finger, wound has healed and 4 stitches were removed.  She also is asking for refill of her premarin.

## 2022-11-10 LAB — CUP PACEART REMOTE DEVICE CHECK
Date Time Interrogation Session: 20240522230126
Implantable Pulse Generator Implant Date: 20210902

## 2022-11-15 ENCOUNTER — Ambulatory Visit (INDEPENDENT_AMBULATORY_CARE_PROVIDER_SITE_OTHER): Payer: Medicare HMO

## 2022-11-15 DIAGNOSIS — I639 Cerebral infarction, unspecified: Secondary | ICD-10-CM

## 2022-11-16 NOTE — Progress Notes (Signed)
Carelink Summary Report / Loop Recorder 

## 2022-11-28 ENCOUNTER — Ambulatory Visit: Payer: Medicare HMO | Admitting: Internal Medicine

## 2022-12-07 ENCOUNTER — Ambulatory Visit: Payer: Medicare HMO | Admitting: Internal Medicine

## 2022-12-07 ENCOUNTER — Encounter: Payer: Self-pay | Admitting: Internal Medicine

## 2022-12-07 VITALS — BP 124/78 | Temp 97.6°F | Resp 18 | Ht 66.0 in | Wt 193.0 lb

## 2022-12-07 DIAGNOSIS — I700 Atherosclerosis of aorta: Secondary | ICD-10-CM | POA: Diagnosis not present

## 2022-12-07 DIAGNOSIS — K5901 Slow transit constipation: Secondary | ICD-10-CM

## 2022-12-07 DIAGNOSIS — Z8673 Personal history of transient ischemic attack (TIA), and cerebral infarction without residual deficits: Secondary | ICD-10-CM

## 2022-12-07 DIAGNOSIS — I10 Essential (primary) hypertension: Secondary | ICD-10-CM

## 2022-12-07 DIAGNOSIS — E782 Mixed hyperlipidemia: Secondary | ICD-10-CM

## 2022-12-07 DIAGNOSIS — K5900 Constipation, unspecified: Secondary | ICD-10-CM | POA: Insufficient documentation

## 2022-12-07 NOTE — Assessment & Plan Note (Signed)
She take linzess

## 2022-12-07 NOTE — Assessment & Plan Note (Signed)
stable °

## 2022-12-07 NOTE — Assessment & Plan Note (Signed)
Will repeat on next visit

## 2022-12-07 NOTE — Assessment & Plan Note (Signed)
She take statin and ASA

## 2022-12-07 NOTE — Assessment & Plan Note (Signed)
No residual symptoms 

## 2022-12-07 NOTE — Progress Notes (Signed)
   Office Visit  Subjective   Patient ID: Maria Green   DOB: May 21, 1951   Age: 72 y.o.   MRN: [id number]   Chief Complaint No chief complaint on file.    History of Present Illness 72 years old female is here for follow up. She has history of stroke but no residual symptoms. She has fall and received PT.  She has hyperlipidemia and take atorvastatin 40 mg daily and aspirin for stroke prevention.   She also has chronic constipation and take linzess.   She is c/o her toes get purple but no pain. She is able to walk without any problem. S  She also take premarin for hot flashes.   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   Bean Pod Extract Nausea Only and Other (See Comments)    No "lima beans, please"     Review of Systems Review of Systems  Constitutional: Negative.   HENT: Negative.    Respiratory: Negative.    Cardiovascular: Negative.   Gastrointestinal: Negative.   Neurological: Negative.        Objective:    Vitals BP 124/78 (BP Location: Left Arm, Patient Position: Sitting)   Temp 97.6 F (36.4 C)   Resp 18   Ht 5\' 6"  (1.676 m)   Wt 193 lb (87.5 kg)   BMI 31.15 kg/m    Physical Examination Physical Exam Constitutional:      Appearance: Normal appearance.  HENT:     Head: Normocephalic and atraumatic.  Cardiovascular:     Rate and Rhythm: Normal rate and regular rhythm.     Heart sounds: Normal heart sounds.  Pulmonary:     Effort: Pulmonary effort is normal.     Breath sounds: Normal breath sounds.  Abdominal:     General: Bowel sounds are normal.      Palpations: Abdomen is soft.  Neurological:     General: No focal deficit present.     Mental Status: She is alert and oriented to person, place, and time.        Assessment & Plan:   Hypertension stable  Aortic atherosclerosis (HCC) She take statin and ASA  Mixed hyperlipidemia Will repeat on next visit  History of CVA (cerebrovascular accident) No residual symptoms  Constipation She take linzess    Return in about 3 months (around 03/09/2023).   Eloisa Northern, MD

## 2022-12-12 NOTE — Progress Notes (Signed)
Carelink Summary Report / Loop Recorder 

## 2022-12-13 ENCOUNTER — Ambulatory Visit: Payer: Medicare HMO

## 2022-12-13 DIAGNOSIS — I639 Cerebral infarction, unspecified: Secondary | ICD-10-CM

## 2022-12-13 LAB — CUP PACEART REMOTE DEVICE CHECK
Date Time Interrogation Session: 20240624230121
Implantable Pulse Generator Implant Date: 20210902

## 2023-01-02 ENCOUNTER — Other Ambulatory Visit: Payer: Self-pay

## 2023-01-02 MED ORDER — ATORVASTATIN CALCIUM 40 MG PO TABS: 40.0000 mg | ORAL_TABLET | Freq: Every day | ORAL | 1 refills | Status: DC

## 2023-01-04 NOTE — Progress Notes (Signed)
 Carelink Summary Report / Loop Recorder 

## 2023-01-13 ENCOUNTER — Other Ambulatory Visit: Payer: Self-pay

## 2023-01-13 MED ORDER — D3 MAXIMUM STRENGTH 125 MCG (5000 UT) PO CAPS: 5000.0000 [IU] | ORAL_CAPSULE | Freq: Every day | ORAL | 1 refills | Status: DC

## 2023-01-16 ENCOUNTER — Ambulatory Visit (INDEPENDENT_AMBULATORY_CARE_PROVIDER_SITE_OTHER): Payer: Medicare HMO

## 2023-01-16 DIAGNOSIS — I639 Cerebral infarction, unspecified: Secondary | ICD-10-CM

## 2023-01-16 LAB — CUP PACEART REMOTE DEVICE CHECK
Date Time Interrogation Session: 20240727230413
Implantable Pulse Generator Implant Date: 20210902

## 2023-02-02 NOTE — Progress Notes (Signed)
Carelink Summary Report / Loop Recorder 

## 2023-02-19 LAB — CUP PACEART REMOTE DEVICE CHECK
Date Time Interrogation Session: 20240829230226
Implantable Pulse Generator Implant Date: 20210902

## 2023-02-21 ENCOUNTER — Ambulatory Visit (INDEPENDENT_AMBULATORY_CARE_PROVIDER_SITE_OTHER): Payer: Medicare HMO

## 2023-02-21 DIAGNOSIS — I639 Cerebral infarction, unspecified: Secondary | ICD-10-CM | POA: Diagnosis not present

## 2023-02-24 ENCOUNTER — Other Ambulatory Visit: Payer: Self-pay | Admitting: Internal Medicine

## 2023-03-06 NOTE — Progress Notes (Signed)
Carelink Summary Report / Loop Recorder 

## 2023-03-10 ENCOUNTER — Encounter: Payer: Self-pay | Admitting: Internal Medicine

## 2023-03-10 ENCOUNTER — Ambulatory Visit: Payer: Medicare HMO | Admitting: Internal Medicine

## 2023-03-10 VITALS — BP 110/70 | HR 70 | Temp 97.1°F | Resp 18 | Ht 66.0 in | Wt 196.1 lb

## 2023-03-10 DIAGNOSIS — Z131 Encounter for screening for diabetes mellitus: Secondary | ICD-10-CM | POA: Insufficient documentation

## 2023-03-10 DIAGNOSIS — E782 Mixed hyperlipidemia: Secondary | ICD-10-CM

## 2023-03-10 DIAGNOSIS — Z23 Encounter for immunization: Secondary | ICD-10-CM | POA: Diagnosis not present

## 2023-03-10 DIAGNOSIS — Z6831 Body mass index (BMI) 31.0-31.9, adult: Secondary | ICD-10-CM | POA: Diagnosis not present

## 2023-03-10 DIAGNOSIS — I700 Atherosclerosis of aorta: Secondary | ICD-10-CM | POA: Diagnosis not present

## 2023-03-10 DIAGNOSIS — Z720 Tobacco use: Secondary | ICD-10-CM | POA: Insufficient documentation

## 2023-03-10 DIAGNOSIS — Z8673 Personal history of transient ischemic attack (TIA), and cerebral infarction without residual deficits: Secondary | ICD-10-CM

## 2023-03-10 DIAGNOSIS — M8580 Other specified disorders of bone density and structure, unspecified site: Secondary | ICD-10-CM | POA: Diagnosis not present

## 2023-03-10 DIAGNOSIS — Z0000 Encounter for general adult medical examination without abnormal findings: Secondary | ICD-10-CM | POA: Diagnosis not present

## 2023-03-10 MED ORDER — PREVNAR 20 0.5 ML IM SUSY: 0.5000 mL | PREFILLED_SYRINGE | INTRAMUSCULAR | 0 refills | Status: AC

## 2023-03-10 MED ORDER — LINACLOTIDE 145 MCG PO CAPS: 145.0000 ug | ORAL_CAPSULE | Freq: Every day | ORAL | 6 refills | Status: DC

## 2023-03-10 NOTE — Assessment & Plan Note (Signed)
She is not ready to quit smoking

## 2023-03-10 NOTE — Assessment & Plan Note (Signed)
She will continue to watch her diet.

## 2023-03-10 NOTE — Assessment & Plan Note (Signed)
She take atorvastatin and will repeat lipid panel today.

## 2023-03-10 NOTE — Assessment & Plan Note (Addendum)
I will give her FIT test kit, I will give her flu shot here and will send pneumonia vaccine.  She did not want shingle vaccine. She has COVID vaccine. She will get from pharmacy.

## 2023-03-10 NOTE — Progress Notes (Signed)
Office Visit  Subjective   Patient ID: Maria Green   DOB: 09/25/1950   Age: 72 y.o.   MRN: [id number]   Chief Complaint No chief complaint on file.    History of Present Illness The patient is a 72 year old American Indian/[place] Native female who has come to this office for her yearly Annual Wellness Visit. She is followed by:  The patient's medications include aspirin 81 mg oral tablet,chewable, atorvastatin 40 mg oral tablet, Premarin 0.625 mg tablet, paroxetine 12.5 mg, and Vitamin D3 125 mcg (5,000 unit) tablet and she does not take supplements.. There have not been any problems with hospital stays, or injuries.  The patient's functional ability is noted by direct observation and history, to be within normal limits. Her cognitive function is within normal limits. She reports that there are no new home safety issues . There are previously identified or new risk factors present: at risk for falls and She has one fall within last one year. These factors do not require a specific intervention at this time but will be monitored by this office.  The patient states she has been complaint with recommended diet, level of exercise and medications.      She has Osteopenia with a T-score of -2.3. She need repeat dexa scan next year. She take vitamin D replacement.   She has stroke 2021 and no residual weakness. She take aspirin 81 mg and atorvastatin for stroke prevention. She has has pacemaker in place and she also has loop recorder placed this year by cardiologist.   She live alone, she smoke 1/2 PPD. She occasionally drink a beer on special occasion.   She has colonoscopy in 2017. 3 polyps were removed but she never has another colonoscopy and does not want either. She does not get mammogram because she has pacemaker. She has hysterectomy and does not need pap smear.  She get flu shot, she never has pneumonia vaccine and no shingle vaccine.    She has IBS with constipation and says  since she stopped linzess her constipation is worse again.     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   Bean Pod Extract Nausea Only and Other (See Comments)    No "lima beans, please"     Review of Systems Review of Systems  Constitutional: Negative.   HENT: Negative.    Respiratory: Negative.    Cardiovascular: Negative.   Gastrointestinal: Negative.   Neurological: Negative.        Objective:    Vitals BP 110/70 (BP Location: Left Arm, Patient Position: Sitting, Cuff Size: Normal)   Pulse 70   Temp (!) 97.1 F (36.2 C)   Resp 18   Ht 5\' 6"  (1.676 m)   Wt 196 lb 2 oz (89 kg)   SpO2 98%   BMI 31.66 kg/m    Physical Examination Physical Exam Constitutional:      Appearance: Normal appearance.  HENT:     Head: Normocephalic and atraumatic.  Eyes:     Extraocular Movements: Extraocular movements intact.  Pupils: Pupils are equal, round, and reactive to light.  Cardiovascular:     Rate and Rhythm: Normal rate and regular rhythm.     Heart sounds: Normal heart sounds.  Pulmonary:     Effort: Pulmonary effort is normal.     Breath sounds: Normal breath sounds.  Abdominal:     General: Bowel sounds are normal.     Palpations: Abdomen is soft.  Neurological:     General: No focal deficit present.     Mental Status: She is alert and oriented to person, place, and time.  Psychiatric:        Mood and Affect: Mood normal.        Behavior: Behavior normal.        Assessment & Plan:   Aortic atherosclerosis (HCC) Will keep LDL under control.  History of CVA  (cerebrovascular accident) She take aspirin and atorvastatin for prevention. No residual effect.   Mixed hyperlipidemia She take atorvastatin and will repeat lipid panel today.  BMI 31.0-31.9,adult She will continue to watch her diet.  Osteopenia I will repeat dexa scan next year.  Tobacco abuse She is not ready to quit smoking.   Screening for diabetes mellitus (DM) I will give her FIT test kit, I will give her flu shot here and will send pneumonia vaccine.  She did not want shingle vaccine. She has COVID vaccine. She will get from pharmacy.     Return in about 3 months (around 06/09/2023).   Eloisa Northern, MD

## 2023-03-10 NOTE — Assessment & Plan Note (Signed)
She take aspirin and atorvastatin for prevention. No residual effect.

## 2023-03-10 NOTE — Assessment & Plan Note (Signed)
I will repeat dexa scan next year.

## 2023-03-10 NOTE — Assessment & Plan Note (Signed)
Will keep LDL under control.

## 2023-03-11 LAB — CMP14 + ANION GAP
ALT: 14 IU/L (ref 0–32)
AST: 20 IU/L (ref 0–40)
Albumin: 3.8 g/dL (ref 3.8–4.8)
Alkaline Phosphatase: 91 IU/L (ref 44–121)
Anion Gap: 13 mmol/L (ref 10.0–18.0)
BUN/Creatinine Ratio: 23 (ref 12–28)
BUN: 17 mg/dL (ref 8–27)
Bilirubin Total: 0.4 mg/dL (ref 0.0–1.2)
CO2: 22 mmol/L (ref 20–29)
Calcium: 9.4 mg/dL (ref 8.7–10.3)
Chloride: 106 mmol/L (ref 96–106)
Creatinine, Ser: 0.75 mg/dL (ref 0.57–1.00)
Globulin, Total: 2.6 g/dL (ref 1.5–4.5)
Glucose: 94 mg/dL (ref 70–99)
Potassium: 5.3 mmol/L — ABNORMAL HIGH (ref 3.5–5.2)
Sodium: 141 mmol/L (ref 134–144)
Total Protein: 6.4 g/dL (ref 6.0–8.5)
eGFR: 85 mL/min/{1.73_m2} (ref >59)

## 2023-03-11 LAB — LIPID PANEL
Chol/HDL Ratio: 2 ratio (ref 0.0–4.4)
Cholesterol, Total: 187 mg/dL (ref 100–199)
HDL: 93 mg/dL (ref >39)
LDL Chol Calc (NIH): 77 mg/dL (ref 0–99)
Triglycerides: 100 mg/dL (ref 0–149)
VLDL Cholesterol Cal: 17 mg/dL (ref 5–40)

## 2023-03-11 LAB — HEMOGLOBIN A1C
Est. average glucose Bld gHb Est-mCnc: 108 mg/dL
Hgb A1c MFr Bld: 5.4 % (ref 4.8–5.6)

## 2023-03-17 NOTE — Progress Notes (Signed)
Patient called.  Left message for patient to call back.

## 2023-03-20 NOTE — Progress Notes (Signed)
Patient called.  Patient aware.  

## 2023-03-22 LAB — CUP PACEART REMOTE DEVICE CHECK
Date Time Interrogation Session: 20241001230100
Implantable Pulse Generator Implant Date: 20210902

## 2023-03-25 ENCOUNTER — Other Ambulatory Visit: Payer: Self-pay | Admitting: Internal Medicine

## 2023-03-27 ENCOUNTER — Ambulatory Visit (INDEPENDENT_AMBULATORY_CARE_PROVIDER_SITE_OTHER): Payer: Medicare HMO

## 2023-03-27 DIAGNOSIS — I639 Cerebral infarction, unspecified: Secondary | ICD-10-CM | POA: Diagnosis not present

## 2023-04-12 NOTE — Progress Notes (Signed)
Carelink Summary Report / Loop Recorder 

## 2023-04-18 ENCOUNTER — Ambulatory Visit: Payer: Medicare HMO | Admitting: Student

## 2023-04-18 ENCOUNTER — Encounter: Payer: Self-pay | Admitting: Student

## 2023-04-18 VITALS — BP 96/68 | HR 93 | Temp 98.4°F | Resp 16

## 2023-04-18 DIAGNOSIS — J069 Acute upper respiratory infection, unspecified: Secondary | ICD-10-CM | POA: Diagnosis not present

## 2023-04-18 LAB — POC INFLUENZA A&B (BINAX/QUICKVUE)
Influenza A, POC: NEGATIVE
Influenza B, POC: NEGATIVE

## 2023-04-18 LAB — POCT RAPID STREP A (OFFICE): Rapid Strep A Screen: POSITIVE — AB

## 2023-04-18 LAB — POC COVID19 BINAXNOW: SARS Coronavirus 2 Ag: POSITIVE — AB

## 2023-04-18 MED ORDER — FLUTICASONE PROPIONATE 50 MCG/ACT NA SUSP: 2.0000 | Freq: Every day | NASAL | 0 refills | Status: AC

## 2023-04-18 MED ORDER — PENICILLIN V POTASSIUM 500 MG PO TABS: 500.0000 mg | ORAL_TABLET | Freq: Two times a day (BID) | ORAL | 0 refills | Status: AC

## 2023-04-18 MED ORDER — OXYMETAZOLINE HCL 0.05 % NA SOLN: 1.0000 | Freq: Two times a day (BID) | NASAL | 0 refills | Status: DC

## 2023-04-18 MED ORDER — NAPROXEN 500 MG PO TABS: 500.0000 mg | ORAL_TABLET | Freq: Two times a day (BID) | ORAL | 0 refills | Status: AC

## 2023-04-18 MED ORDER — PROMETHAZINE-DM 6.25-15 MG/5ML PO SYRP: 5.0000 mL | ORAL_SOLUTION | Freq: Four times a day (QID) | ORAL | 0 refills | Status: AC | PRN

## 2023-04-18 NOTE — Assessment & Plan Note (Signed)
This assessment was completed via car: The patient is positive for Covid and Strep A.  She will use naproxen 500 mg, flonase, penicillin 500 mg for 10 days and promethazine DM for her symptoms. Discussed quarantine, she should increase hydration. Follow up if symptoms persist.

## 2023-04-18 NOTE — Progress Notes (Signed)
Acute Office Visit  Subjective:     Patient ID: Maria Green, female    DOB: 12/11/1950, 72 y.o.   MRN: [id number]  Chief Complaint  Patient presents with   Sore Throat    C/o: x 3 weeks, cough, congestion, diarrhea, vomiting, headache, sore throat, sinus pressure and bloody nose today.    HPI  Upper Respiratory Infection Patient complains of symptoms of a URI. Symptoms include congestion, cough described as productive of clear sputum, low grade fever, nausea with vomiting, productive cough with  tenacious colored sputum, and sore throat. She has also had diarrhea, nose bleeds, headache and body aches. Onset of symptoms was 3 weeks ago, and has been gradually worsening since that time. Treatment to date: tylenol and soup.     Review of Systems  Constitutional:  Positive for chills.  HENT:  Positive for congestion, nosebleeds and sore throat.   Eyes: Negative.   Respiratory:  Positive for cough and sputum production. Negative for hemoptysis and shortness of breath.   Cardiovascular: Negative.   Gastrointestinal:  Positive for diarrhea, nausea and vomiting.  Genitourinary: Negative.   Musculoskeletal:  Positive for myalgias.  Skin: Negative.   Neurological:  Positive for headaches.        Objective:    BP 96/68 (BP Location: Right Arm, Patient Position: Sitting)   Pulse 93   Temp 98.4 F (36.9 C) (Temporal)   Resp 16   SpO2 97%    Physical Exam Vitals reviewed.  Constitutional:      Appearance: She is ill-appearing.  HENT:     Nose: Congestion present.     Right Nostril: Epistaxis present.     Left Nostril: Epistaxis present.     Right Turbinates: Enlarged and swollen.     Left Turbinates: Enlarged and swollen.     Right Sinus: Maxillary sinus tenderness and frontal sinus tenderness present.     Left Sinus: Maxillary sinus tenderness and frontal sinus tenderness present.     Mouth/Throat:     Mouth: Mucous membranes are moist. No oral lesions.      Pharynx: Posterior oropharyngeal erythema present. No pharyngeal swelling, oropharyngeal exudate or uvula swelling.     Tonsils: No tonsillar exudate or tonsillar abscesses. 1+ on the right. 1+ on the left.  Eyes:     Conjunctiva/sclera: Conjunctivae normal.  Cardiovascular:     Rate and Rhythm: Normal rate and regular rhythm.     Heart sounds: Normal heart sounds. No murmur heard. Pulmonary:     Effort: Pulmonary effort is normal.     Breath sounds: Normal breath sounds. No decreased air movement. No wheezing, rhonchi or rales.  Skin:    General: Skin is warm and dry.     Capillary Refill: Capillary refill takes less than 2 seconds.     Coloration: Skin is pale.  Neurological:     Mental Status: She is alert and oriented to person, place, and time.  Psychiatric:        Mood and Affect: Mood normal.        Behavior: Behavior normal.     Results for orders placed or performed in visit on 04/18/23  POC COVID-19 BinaxNow  Result Value Ref Range   SARS Coronavirus 2 Ag Positive (A) Negative  POC Influenza A&B(BINAX/QUICKVUE)  Result Value Ref Range   Influenza A, POC Negative Negative   Influenza B, POC Negative Negative  POCT rapid strep A  Result Value Ref Range   Rapid Strep A Screen  Positive (A) Negative        Assessment & Plan:   Problem List Items Addressed This Visit     URI with cough and congestion - Primary    This assessment was completed via car: The patient is positive for Covid and Strep A.  She will use naproxen 500 mg, flonase, penicillin 500 mg for 10 days and promethazine DM for her symptoms. Discussed quarantine, she should increase hydration. Follow up if symptoms persist.       Relevant Orders   POC COVID-19 BinaxNow (Completed)   POC Influenza A&B(BINAX/QUICKVUE) (Completed)   POCT rapid strep A (Completed)    Meds ordered this encounter  Medications   oxymetazoline (AFRIN NASAL SPRAY) 0.05 % nasal spray    Sig: Place 1 spray into both  nostrils 2 (two) times daily.    Dispense:  30 mL    Refill:  0   penicillin v potassium (VEETID) 500 MG tablet    Sig: Take 1 tablet (500 mg total) by mouth 2 (two) times daily for 10 days.    Dispense:  20 tablet    Refill:  0   naproxen (NAPROSYN) 500 MG tablet    Sig: Take 1 tablet (500 mg total) by mouth 2 (two) times daily with a meal.    Dispense:  15 tablet    Refill:  0   promethazine-dextromethorphan (PROMETHAZINE-DM) 6.25-15 MG/5ML syrup    Sig: Take 5 mLs by mouth 4 (four) times daily as needed for cough.    Dispense:  118 mL    Refill:  0   fluticasone (FLONASE) 50 MCG/ACT nasal spray    Sig: Place 2 sprays into both nostrils daily.    Dispense:  9.9 mL    Refill:  0    No follow-ups on file.  Edwena Blow, NP

## 2023-04-26 ENCOUNTER — Other Ambulatory Visit: Payer: Self-pay | Admitting: Internal Medicine

## 2023-04-26 LAB — CUP PACEART REMOTE DEVICE CHECK
Date Time Interrogation Session: 20241103230313
Implantable Pulse Generator Implant Date: 20210902

## 2023-05-01 ENCOUNTER — Ambulatory Visit (INDEPENDENT_AMBULATORY_CARE_PROVIDER_SITE_OTHER): Payer: Medicare HMO

## 2023-05-01 DIAGNOSIS — I639 Cerebral infarction, unspecified: Secondary | ICD-10-CM | POA: Diagnosis not present

## 2023-05-04 ENCOUNTER — Other Ambulatory Visit: Payer: Self-pay | Admitting: Internal Medicine

## 2023-05-23 ENCOUNTER — Other Ambulatory Visit: Payer: Self-pay | Admitting: Internal Medicine

## 2023-05-26 NOTE — Progress Notes (Signed)
Carelink Summary Report / Loop Recorder 

## 2023-05-29 DIAGNOSIS — H2513 Age-related nuclear cataract, bilateral: Secondary | ICD-10-CM | POA: Diagnosis not present

## 2023-06-01 ENCOUNTER — Other Ambulatory Visit: Payer: Self-pay | Admitting: Internal Medicine

## 2023-06-05 ENCOUNTER — Ambulatory Visit: Payer: Medicare HMO

## 2023-06-05 DIAGNOSIS — I639 Cerebral infarction, unspecified: Secondary | ICD-10-CM | POA: Diagnosis not present

## 2023-06-05 LAB — CUP PACEART REMOTE DEVICE CHECK
Date Time Interrogation Session: 20241215230156
Implantable Pulse Generator Implant Date: 20210902

## 2023-06-07 ENCOUNTER — Encounter: Payer: Self-pay | Admitting: Internal Medicine

## 2023-06-07 ENCOUNTER — Ambulatory Visit: Payer: Medicare HMO | Admitting: Internal Medicine

## 2023-06-07 VITALS — BP 116/70 | HR 70 | Temp 97.3°F | Resp 18 | Ht 66.0 in | Wt 200.5 lb

## 2023-06-07 DIAGNOSIS — E782 Mixed hyperlipidemia: Secondary | ICD-10-CM

## 2023-06-07 DIAGNOSIS — R197 Diarrhea, unspecified: Secondary | ICD-10-CM | POA: Diagnosis not present

## 2023-06-07 DIAGNOSIS — I700 Atherosclerosis of aorta: Secondary | ICD-10-CM | POA: Diagnosis not present

## 2023-06-07 MED ORDER — METRONIDAZOLE 500 MG PO TABS: 500.0000 mg | ORAL_TABLET | Freq: Three times a day (TID) | ORAL | 0 refills | Status: AC

## 2023-06-07 NOTE — Assessment & Plan Note (Signed)
Her LDL is target controlled 

## 2023-06-07 NOTE — Progress Notes (Signed)
   Office Visit  Subjective   Patient ID: Maria Green   DOB: 09/17/1950   Age: 72 y.o.   MRN: [id number]   Chief Complaint Chief Complaint  Patient presents with   Aortic Atherosclerosis    3 month follow up     History of Present Illness 72 years old female is here for follow up. She is c/o loose bowel movement since morning and she denies any cramps. She went 7 time to bathroom since morning.   She has hyperlipidemia and take atorvastatin 40 mg daily and aspirin for stroke prevention. Her LDL was 77.   She also has chronic constipation and take linzess but because of diarrhea I have suggested to hold it. .    She also take premarin for hot flashes.and is doing better.    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   Bean Pod Extract Nausea Only and Other (See Comments)    No "lima beans, please"     Review of Systems Review of Systems  Constitutional: Negative.   HENT: Negative.    Respiratory: Negative.    Cardiovascular: Negative.   Gastrointestinal:  Positive for abdominal pain and diarrhea.  Neurological: Negative.        Objective:    Vitals BP 116/70 (BP Location: Left Arm, Patient Position: Sitting)   Pulse 70   Temp (!) 97.3 F (36.3 C)   Resp 18   Ht 5\' 6"  (1.676 m)   Wt 200 lb 8 oz (90.9 kg)   SpO2 98%   BMI 32.36 kg/m    Physical Examination Physical Exam Constitutional:      Appearance: Normal appearance.  HENT:     Head: Normocephalic and atraumatic.  Cardiovascular:     Rate and Rhythm: Normal rate and regular rhythm.     Heart sounds: Normal heart  sounds.  Pulmonary:     Effort: Pulmonary effort is normal.     Breath sounds: Normal breath sounds.  Abdominal:     Palpations: Abdomen is soft.     Tenderness: There is abdominal tenderness.  Neurological:     General: No focal deficit present.     Mental Status: She is alert and oriented to person, place, and time.        Assessment & Plan:   Diarrhea of presumed infectious origin She has mild diffuse tenderness so I will start her on flagyl 50 mg three time a day. She will eat Yogurt and if not better then she will call.   Aortic atherosclerosis (HCC) Her LDL is target controlled.   Mixed hyperlipidemia Her LDL in September was target controlled.    Return in about 3 months (around 09/05/2023).   Eloisa Northern, MD

## 2023-06-07 NOTE — Assessment & Plan Note (Signed)
She has mild diffuse tenderness so I will start her on flagyl 50 mg three time a day. She will eat Yogurt and if not better then she will call.

## 2023-06-07 NOTE — Assessment & Plan Note (Signed)
Her LDL in September was target controlled.

## 2023-06-23 ENCOUNTER — Other Ambulatory Visit: Payer: Self-pay | Admitting: Internal Medicine

## 2023-06-29 ENCOUNTER — Other Ambulatory Visit: Payer: Self-pay | Admitting: Internal Medicine

## 2023-07-10 ENCOUNTER — Other Ambulatory Visit: Payer: Self-pay | Admitting: Internal Medicine

## 2023-07-10 ENCOUNTER — Ambulatory Visit (INDEPENDENT_AMBULATORY_CARE_PROVIDER_SITE_OTHER): Payer: Medicare HMO

## 2023-07-10 DIAGNOSIS — I639 Cerebral infarction, unspecified: Secondary | ICD-10-CM

## 2023-07-10 LAB — CUP PACEART REMOTE DEVICE CHECK
Date Time Interrogation Session: 20250119230337
Implantable Pulse Generator Implant Date: 20210902

## 2023-07-12 NOTE — Progress Notes (Signed)
Carelink Summary Report / Loop Recorder 

## 2023-07-22 ENCOUNTER — Other Ambulatory Visit: Payer: Self-pay | Admitting: Internal Medicine

## 2023-07-27 ENCOUNTER — Other Ambulatory Visit: Payer: Self-pay | Admitting: Internal Medicine

## 2023-08-14 ENCOUNTER — Ambulatory Visit (INDEPENDENT_AMBULATORY_CARE_PROVIDER_SITE_OTHER): Payer: Medicare HMO

## 2023-08-14 DIAGNOSIS — I639 Cerebral infarction, unspecified: Secondary | ICD-10-CM

## 2023-08-16 LAB — CUP PACEART REMOTE DEVICE CHECK
Date Time Interrogation Session: 20250223230324
Implantable Pulse Generator Implant Date: 20210902

## 2023-08-17 NOTE — Progress Notes (Signed)
 [facility] Summary Report / Loop Recorder

## 2023-08-26 ENCOUNTER — Other Ambulatory Visit: Payer: Self-pay | Admitting: Internal Medicine

## 2023-09-01 ENCOUNTER — Encounter: Payer: Self-pay | Admitting: Internal Medicine

## 2023-09-01 ENCOUNTER — Ambulatory Visit: Payer: Medicare HMO | Admitting: Internal Medicine

## 2023-09-01 VITALS — BP 128/72 | HR 78 | Temp 97.0°F | Resp 18 | Ht 66.0 in | Wt 207.4 lb

## 2023-09-01 DIAGNOSIS — K5901 Slow transit constipation: Secondary | ICD-10-CM | POA: Diagnosis not present

## 2023-09-01 DIAGNOSIS — E782 Mixed hyperlipidemia: Secondary | ICD-10-CM

## 2023-09-01 NOTE — Progress Notes (Signed)
   Office Visit  Subjective   Patient ID: Maria Green   DOB: 08/17/1950   Age: 73 y.o.   MRN: [id number]   Chief Complaint Chief Complaint  Patient presents with   Follow-up    3 Month Follow UP     History of Present Illness 73 years old female is here for follow up. She was seen by me for diarrhea and that is better now.   She has has history of stroke, no residual symptoms. She live alive alone and her daughter lives next door. She is independent in ADL.    She has hyperlipidemia and take atorvastatin 40 mg daily and aspirin for stroke prevention. Her lipid panel done in 02/2023 was normal.    She also has chronic constipation and was taking linzess.    She also take premarin for hot flashes.   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   Bean Pod Extract Nausea Only and Other (See Comments)    No "lima beans, please"     Review of Systems Review of Systems  Constitutional: Negative.   Respiratory: Negative.    Cardiovascular: Negative.   Gastrointestinal:  Positive for constipation.       Objective:    Vitals BP 128/72 (BP Location: Right Arm, Patient Position: Sitting)   Pulse 78   Temp (!) 97 F (36.1 C) (Temporal)   Resp 18   Ht 5\' 6"  (1.676 m)   Wt 207 lb 6.4 oz (94.1 kg)   BMI 33.48 kg/m    Physical Examination Physical Exam Constitutional:      Appearance: Normal appearance.  HENT:     Head: Normocephalic and atraumatic.  Cardiovascular:     Rate and Rhythm: Normal rate and regular rhythm.     Heart sounds: Normal heart sounds.  Pulmonary:     Effort:  Pulmonary effort is normal.     Breath sounds: Normal breath sounds.  Abdominal:     General: Bowel sounds are normal.     Palpations: Abdomen is soft.  Neurological:     General: No focal deficit present.     Mental Status: She is alert and oriented to person, place, and time.        Assessment & Plan:   Constipation She will drink more water, she will continue with Linzess 145 mcg daily.  Mixed hyperlipidemia Well controlled on atorvastatin 40 mg daily    Return in about 3 months (around 12/02/2023).   Eloisa Northern, MD

## 2023-09-01 NOTE — Assessment & Plan Note (Signed)
Well controlled on atorvastatin 40 mg daily.

## 2023-09-01 NOTE — Assessment & Plan Note (Signed)
 She will drink more water, she will continue with Linzess 145 mcg daily.

## 2023-09-18 ENCOUNTER — Ambulatory Visit: Payer: Medicare HMO

## 2023-09-18 DIAGNOSIS — I639 Cerebral infarction, unspecified: Secondary | ICD-10-CM | POA: Diagnosis not present

## 2023-09-18 LAB — CUP PACEART REMOTE DEVICE CHECK
Date Time Interrogation Session: 20250330230205
Implantable Pulse Generator Implant Date: 20210902

## 2023-09-20 NOTE — Progress Notes (Signed)
 [facility] Summary Report / Loop Recorder

## 2023-09-20 NOTE — Addendum Note (Signed)
 Addended by: Geralyn Flash D on: 09/20/2023 01:18 PM   Modules accepted: Orders

## 2023-09-25 ENCOUNTER — Other Ambulatory Visit: Payer: Self-pay | Admitting: Internal Medicine

## 2023-10-05 ENCOUNTER — Other Ambulatory Visit: Payer: Self-pay | Admitting: Internal Medicine

## 2023-10-12 ENCOUNTER — Other Ambulatory Visit: Payer: Self-pay | Admitting: Internal Medicine

## 2023-10-20 ENCOUNTER — Other Ambulatory Visit: Payer: Self-pay | Admitting: Internal Medicine

## 2023-10-23 ENCOUNTER — Ambulatory Visit (INDEPENDENT_AMBULATORY_CARE_PROVIDER_SITE_OTHER): Payer: Medicare HMO

## 2023-10-23 DIAGNOSIS — I639 Cerebral infarction, unspecified: Secondary | ICD-10-CM

## 2023-10-23 LAB — CUP PACEART REMOTE DEVICE CHECK
Date Time Interrogation Session: 20250504230509
Implantable Pulse Generator Implant Date: 20210902

## 2023-11-02 NOTE — Progress Notes (Signed)
 [facility] Summary Report / Loop Recorder

## 2023-11-05 ENCOUNTER — Other Ambulatory Visit: Payer: Self-pay | Admitting: Internal Medicine

## 2023-11-18 ENCOUNTER — Other Ambulatory Visit: Payer: Self-pay | Admitting: Internal Medicine

## 2023-11-23 ENCOUNTER — Ambulatory Visit (INDEPENDENT_AMBULATORY_CARE_PROVIDER_SITE_OTHER)

## 2023-11-23 DIAGNOSIS — I639 Cerebral infarction, unspecified: Secondary | ICD-10-CM

## 2023-11-23 LAB — CUP PACEART REMOTE DEVICE CHECK
Date Time Interrogation Session: 20250604230238
Implantable Pulse Generator Implant Date: 20210902

## 2023-11-26 ENCOUNTER — Ambulatory Visit: Payer: Self-pay | Admitting: Internal Medicine

## 2023-12-04 ENCOUNTER — Encounter: Payer: Self-pay | Admitting: Internal Medicine

## 2023-12-04 ENCOUNTER — Ambulatory Visit: Admitting: Internal Medicine

## 2023-12-04 VITALS — BP 124/72 | HR 82 | Temp 97.8°F | Resp 18 | Ht 66.0 in | Wt 194.2 lb

## 2023-12-04 DIAGNOSIS — I700 Atherosclerosis of aorta: Secondary | ICD-10-CM | POA: Diagnosis not present

## 2023-12-04 DIAGNOSIS — K5901 Slow transit constipation: Secondary | ICD-10-CM | POA: Diagnosis not present

## 2023-12-04 DIAGNOSIS — Z720 Tobacco use: Secondary | ICD-10-CM

## 2023-12-04 DIAGNOSIS — M25562 Pain in left knee: Secondary | ICD-10-CM | POA: Diagnosis not present

## 2023-12-04 DIAGNOSIS — Z6831 Body mass index (BMI) 31.0-31.9, adult: Secondary | ICD-10-CM

## 2023-12-04 DIAGNOSIS — E782 Mixed hyperlipidemia: Secondary | ICD-10-CM | POA: Diagnosis not present

## 2023-12-04 DIAGNOSIS — N951 Menopausal and female climacteric states: Secondary | ICD-10-CM | POA: Insufficient documentation

## 2023-12-04 DIAGNOSIS — M25561 Pain in right knee: Secondary | ICD-10-CM | POA: Diagnosis not present

## 2023-12-04 DIAGNOSIS — G8929 Other chronic pain: Secondary | ICD-10-CM | POA: Insufficient documentation

## 2023-12-04 MED ORDER — NICOTINE 21 MG/24HR TD PT24: 21.0000 mg | MEDICATED_PATCH | TRANSDERMAL | 0 refills | Status: AC

## 2023-12-04 MED ORDER — NICOTINE 14 MG/24HR TD PT24: 14.0000 mg | MEDICATED_PATCH | TRANSDERMAL | 0 refills | Status: AC

## 2023-12-04 NOTE — Assessment & Plan Note (Signed)
 She will take Tylenol  arthritis for pain.

## 2023-12-04 NOTE — Assessment & Plan Note (Signed)
 I will start her on Nicoderm patch 21 mg for 14 days then 14 mg for next 14 days.  She will not smoke urine this time. she will call if she has any problem.

## 2023-12-04 NOTE — Assessment & Plan Note (Signed)
 She will continue with aspirin  81 mg daily and atorvastatin .

## 2023-12-04 NOTE — Assessment & Plan Note (Signed)
 Her cholesterol his target controlled.

## 2023-12-04 NOTE — Assessment & Plan Note (Signed)
 Better with Linzess 

## 2023-12-04 NOTE — Assessment & Plan Note (Signed)
 She will continue to watch her diet and I have discussed with her to use exercise bicycle because of knee pain.

## 2023-12-04 NOTE — Assessment & Plan Note (Signed)
 She will continue with Premarin .  She understand the risk.

## 2023-12-04 NOTE — Progress Notes (Signed)
 Office Visit  Subjective   Patient ID: Maria Green   DOB: 11/28/1950   Age: 73 y.o.   MRN: [id number]   Chief Complaint Chief Complaint  Patient presents with   Follow-up    3 month follow up     History of Present Illness 73 years old female is here for follow up. She says that she is watching her diet and she has lost 13 pounds since March 25.  She says that she can not walk much because of knee pain. She does not take any medications for knee pain.    She has has history of stroke, no residual symptoms. She live alive alone and her daughter lives next door. She is independent in ADL.    She has hyperlipidemia and take atorvastatin  40 mg daily and aspirin  for stroke prevention. Her lipid panel done in 02/2023 was normal.   Her potassium was slightly elevated on last blood draw and I will repeat that today.   She also has chronic constipation and that is better with Linzess .    She also take premarin  for hot flashes.   She says that she has been taking it for long time and if she missed that she has a mood swings and hot flashes that she does not tolerate.    She understands the risk and does not want to try any was.   She still smoke and wanted to quit.  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   Bean Pod Extract Nausea Only and Other (See Comments)    No lima beans, please     Review of Systems Review of Systems  Constitutional: Negative.   HENT: Negative.    Respiratory: Negative.    Cardiovascular:  Negative.   Gastrointestinal: Negative.   Musculoskeletal:  Positive for joint pain.  Neurological: Negative.        Objective:    Vitals BP 124/72   Pulse 82   Temp 97.8 F (36.6 C)   Resp 18   Ht 5' 6 (1.676 m)   Wt 194 lb 4 oz (88.1 kg)   SpO2 99%   BMI 31.35 kg/m    Physical Examination Physical Exam Constitutional:      Appearance: Normal appearance.  HENT:     Head: Normocephalic and atraumatic.   Cardiovascular:     Rate and Rhythm: Normal rate and regular rhythm.     Heart sounds: Normal heart sounds.  Pulmonary:     Effort: Pulmonary effort is normal.     Breath sounds: Normal breath sounds.  Abdominal:     General: Abdomen is flat.     Palpations: Abdomen is soft.   Neurological:     General: No focal deficit present.     Mental Status: She is alert and oriented to person, place, and time.        Assessment & Plan:   Aortic atherosclerosis (HCC)  She will continue with aspirin  81 mg  daily and atorvastatin .  Vasomotor symptoms due to menopause   She will continue with Premarin .  She understand the risk.  Tobacco abuse   I will start her on Nicoderm patch 21 mg for 14 days then 14 mg for next 14 days.  She will not smoke urine this time. she will call if she has any problem.  Mixed hyperlipidemia   Her cholesterol his target controlled.  Constipation   Better with Linzess   Chronic pain of both knees  She will take Tylenol  arthritis for pain.  BMI 31.0-31.9,adult   She will continue to watch her diet and I have discussed with her to use exercise bicycle because of knee pain.    Return in about 3 months (around 03/05/2024).   Tita Form, MD

## 2023-12-05 ENCOUNTER — Ambulatory Visit: Payer: Self-pay

## 2023-12-05 LAB — CMP14 + ANION GAP
ALT: 10 IU/L (ref 0–32)
AST: 18 IU/L (ref 0–40)
Albumin: 3.8 g/dL (ref 3.8–4.8)
Alkaline Phosphatase: 107 IU/L (ref 44–121)
Anion Gap: 14 mmol/L (ref 10.0–18.0)
BUN/Creatinine Ratio: 28 (ref 12–28)
BUN: 22 mg/dL (ref 8–27)
Bilirubin Total: 0.3 mg/dL (ref 0.0–1.2)
CO2: 19 mmol/L — ABNORMAL LOW (ref 20–29)
Calcium: 9.4 mg/dL (ref 8.7–10.3)
Chloride: 107 mmol/L — ABNORMAL HIGH (ref 96–106)
Creatinine, Ser: 0.8 mg/dL (ref 0.57–1.00)
Globulin, Total: 2.6 g/dL (ref 1.5–4.5)
Glucose: 92 mg/dL (ref 70–99)
Potassium: 4.9 mmol/L (ref 3.5–5.2)
Sodium: 140 mmol/L (ref 134–144)
Total Protein: 6.4 g/dL (ref 6.0–8.5)
eGFR: 78 mL/min/{1.73_m2} (ref >59)

## 2023-12-12 NOTE — Progress Notes (Signed)
 [facility] Summary Report / Loop Recorder

## 2023-12-17 ENCOUNTER — Other Ambulatory Visit: Payer: Self-pay | Admitting: Internal Medicine

## 2023-12-25 ENCOUNTER — Ambulatory Visit (INDEPENDENT_AMBULATORY_CARE_PROVIDER_SITE_OTHER)

## 2023-12-25 DIAGNOSIS — I639 Cerebral infarction, unspecified: Secondary | ICD-10-CM

## 2023-12-25 LAB — CUP PACEART REMOTE DEVICE CHECK
Date Time Interrogation Session: 20250706230534
Implantable Pulse Generator Implant Date: 20210902

## 2023-12-28 ENCOUNTER — Ambulatory Visit: Payer: Self-pay | Admitting: Internal Medicine

## 2024-01-11 NOTE — Progress Notes (Signed)
 [facility] Summary Report / Loop Recorder

## 2024-01-16 ENCOUNTER — Other Ambulatory Visit: Payer: Self-pay | Admitting: Internal Medicine

## 2024-01-18 ENCOUNTER — Other Ambulatory Visit: Payer: Self-pay | Admitting: Internal Medicine

## 2024-01-25 ENCOUNTER — Ambulatory Visit: Payer: Self-pay | Admitting: Internal Medicine

## 2024-01-25 ENCOUNTER — Ambulatory Visit

## 2024-01-25 DIAGNOSIS — I639 Cerebral infarction, unspecified: Secondary | ICD-10-CM

## 2024-01-25 LAB — CUP PACEART REMOTE DEVICE CHECK
Date Time Interrogation Session: 20250806231017
Implantable Pulse Generator Implant Date: 20210902

## 2024-02-18 ENCOUNTER — Other Ambulatory Visit: Payer: Self-pay | Admitting: Internal Medicine

## 2024-02-26 ENCOUNTER — Ambulatory Visit: Payer: Self-pay | Admitting: Internal Medicine

## 2024-02-26 ENCOUNTER — Ambulatory Visit

## 2024-02-26 DIAGNOSIS — I639 Cerebral infarction, unspecified: Secondary | ICD-10-CM

## 2024-02-26 LAB — CUP PACEART REMOTE DEVICE CHECK
Date Time Interrogation Session: 20250906231554
Implantable Pulse Generator Implant Date: 20210902

## 2024-03-04 ENCOUNTER — Ambulatory Visit: Admitting: Internal Medicine

## 2024-03-06 ENCOUNTER — Ambulatory Visit: Admitting: Internal Medicine

## 2024-03-07 ENCOUNTER — Encounter

## 2024-03-07 NOTE — Progress Notes (Signed)
 Remote Loop Recorder Transmission

## 2024-03-16 NOTE — Progress Notes (Signed)
 Remote Loop Recorder Transmission

## 2024-03-19 ENCOUNTER — Other Ambulatory Visit: Payer: Self-pay | Admitting: Internal Medicine

## 2024-03-28 ENCOUNTER — Ambulatory Visit (INDEPENDENT_AMBULATORY_CARE_PROVIDER_SITE_OTHER)

## 2024-03-28 ENCOUNTER — Encounter

## 2024-03-28 DIAGNOSIS — I639 Cerebral infarction, unspecified: Secondary | ICD-10-CM | POA: Diagnosis not present

## 2024-03-28 LAB — CUP PACEART REMOTE DEVICE CHECK
Date Time Interrogation Session: 20251008230239
Implantable Pulse Generator Implant Date: 20210902

## 2024-04-01 NOTE — Progress Notes (Signed)
 Remote Loop Recorder Transmission

## 2024-04-03 ENCOUNTER — Ambulatory Visit: Payer: Self-pay | Admitting: Internal Medicine

## 2024-04-03 NOTE — Progress Notes (Signed)
 Remote Loop Recorder Transmission

## 2024-04-08 ENCOUNTER — Encounter

## 2024-04-18 ENCOUNTER — Other Ambulatory Visit: Payer: Self-pay | Admitting: Internal Medicine

## 2024-04-19 ENCOUNTER — Other Ambulatory Visit: Payer: Self-pay | Admitting: Internal Medicine

## 2024-04-24 ENCOUNTER — Encounter: Payer: Self-pay | Admitting: Internal Medicine

## 2024-04-24 ENCOUNTER — Ambulatory Visit: Admitting: Internal Medicine

## 2024-04-24 VITALS — BP 124/78 | HR 61 | Temp 97.5°F | Resp 18 | Ht 66.0 in | Wt 182.0 lb

## 2024-04-24 DIAGNOSIS — E782 Mixed hyperlipidemia: Secondary | ICD-10-CM | POA: Diagnosis not present

## 2024-04-24 DIAGNOSIS — N951 Menopausal and female climacteric states: Secondary | ICD-10-CM | POA: Diagnosis not present

## 2024-04-24 DIAGNOSIS — K5901 Slow transit constipation: Secondary | ICD-10-CM

## 2024-04-24 DIAGNOSIS — I700 Atherosclerosis of aorta: Secondary | ICD-10-CM

## 2024-04-24 DIAGNOSIS — Z6829 Body mass index (BMI) 29.0-29.9, adult: Secondary | ICD-10-CM | POA: Diagnosis not present

## 2024-04-24 MED ORDER — PREMARIN 0.625 MG PO TABS: 0.6250 mg | ORAL_TABLET | Freq: Every day | ORAL | 6 refills | Status: AC

## 2024-04-24 NOTE — Progress Notes (Signed)
 Office Visit  Subjective   Patient ID: Maria Green   DOB: [birth year]   Age: 73 y.o.   MRN: [id number]   Chief Complaint Chief Complaint  Patient presents with   Follow-up     History of Present Illness 73 years old female is here for follow up. She says that she is watching her diet and she has lost 12 pounds since last visit. Her weight is 182 lb with BMI of 29. She does not take any medications for knee pain.    She has has history of stroke, no residual symptoms. She live alive alone and her daughter lives next door. She is independent in ADL.    She has hyperlipidemia and take atorvastatin  40 mg daily and aspirin  for stroke prevention. Her lipid panel done in 02/2023 was normal.   Her potassium was slightly elevated on last blood draw and I will repeat that today.   She also has chronic constipation and that is better with Linzess .    She also take premarin  for hot flashes.  It is helping with her symptoms. She understands the risk and does not want to try any was.    She still smoke and wanted to quit.  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   Bean Pod Extract Nausea Only and Other (See Comments)    No lima beans, please     Review of Systems Review of Systems  Constitutional: Negative.   HENT: Negative.    Respiratory: Negative.    Cardiovascular: Negative.   Gastrointestinal: Negative.   Neurological: Negative.        Objective:    Vitals BP 124/78 (BP Location: Left Arm, Patient Position: Sitting,  Cuff Size: Normal)   Pulse 61   Temp (!) 97.5 F (36.4 C)   Resp 18   Ht 5' 6 (1.676 m)   Wt 182 lb (82.6 kg) Comment: Weighed at home this morning  SpO2 92%   BMI 29.38 kg/m    Physical Examination Physical Exam Constitutional:      Appearance: Normal appearance.  HENT:     Head: Normocephalic and atraumatic.  Eyes:     Extraocular Movements: Extraocular movements intact.     Pupils: Pupils are equal, round, and reactive to light.  Cardiovascular:     Rate and Rhythm: Normal rate and regular rhythm.     Heart sounds: Normal heart sounds.  Pulmonary:     Effort: Pulmonary effort is normal.     Breath sounds: Normal breath sounds.  Abdominal:     General: Bowel sounds are normal.     Palpations: Abdomen is soft.  Neurological:     General: No focal deficit present.     Mental Status: She is alert and oriented to person, place, and time.        Assessment & Plan:   Aortic atherosclerosis  Will keep LDL on target control and she take aspirin  81 mg for prophylaxis.  Vasomotor  symptoms due to menopause   She will continue with Premarin .  She understand the risk.  Mixed hyperlipidemia   She take atorvastatin  40 mg daily without any side effect.  Her lipid panel was controlled.  Constipation   She is doing better and will continue to monitor.  BMI 29.0-29.9,adult   She has lost 12 more lb and her weight is 182 lb with BMI of 29. She will continue to watch her diet.    Return in about 3 months (around 07/25/2024).   Roetta Dare, MD

## 2024-04-27 DIAGNOSIS — Z6829 Body mass index (BMI) 29.0-29.9, adult: Secondary | ICD-10-CM | POA: Insufficient documentation

## 2024-04-27 NOTE — Assessment & Plan Note (Signed)
 She is doing better and will continue to monitor.

## 2024-04-27 NOTE — Assessment & Plan Note (Signed)
 She will continue with Premarin .  She understand the risk.

## 2024-04-27 NOTE — Assessment & Plan Note (Signed)
 Will keep LDL on target control and she take aspirin  81 mg for prophylaxis.

## 2024-04-27 NOTE — Assessment & Plan Note (Signed)
 She take atorvastatin  40 mg daily without any side effect.  Her lipid panel was controlled.

## 2024-04-27 NOTE — Assessment & Plan Note (Signed)
 She has lost 12 more lb and her weight is 182 lb with BMI of 29. She will continue to watch her diet.

## 2024-04-28 ENCOUNTER — Ambulatory Visit

## 2024-04-28 DIAGNOSIS — I639 Cerebral infarction, unspecified: Secondary | ICD-10-CM | POA: Diagnosis not present

## 2024-04-28 LAB — CUP PACEART REMOTE DEVICE CHECK
Date Time Interrogation Session: 20251108230034
Implantable Pulse Generator Implant Date: 20210902

## 2024-04-29 ENCOUNTER — Encounter

## 2024-05-02 NOTE — Progress Notes (Signed)
 Remote Loop Recorder Transmission

## 2024-05-05 ENCOUNTER — Ambulatory Visit: Payer: Self-pay | Admitting: Internal Medicine

## 2024-05-09 ENCOUNTER — Encounter

## 2024-05-29 ENCOUNTER — Encounter

## 2024-05-30 ENCOUNTER — Encounter

## 2024-05-31 ENCOUNTER — Ambulatory Visit: Attending: Internal Medicine

## 2024-05-31 DIAGNOSIS — I639 Cerebral infarction, unspecified: Secondary | ICD-10-CM

## 2024-06-01 LAB — CUP PACEART REMOTE DEVICE CHECK
Date Time Interrogation Session: 20251211230349
Implantable Pulse Generator Implant Date: 20210902

## 2024-06-02 ENCOUNTER — Ambulatory Visit: Payer: Self-pay | Admitting: Internal Medicine

## 2024-06-07 NOTE — Progress Notes (Signed)
 Remote Loop Recorder Transmission

## 2024-06-10 ENCOUNTER — Encounter

## 2024-06-29 ENCOUNTER — Encounter

## 2024-07-01 ENCOUNTER — Ambulatory Visit: Attending: Cardiology

## 2024-07-01 ENCOUNTER — Encounter

## 2024-07-01 DIAGNOSIS — I639 Cerebral infarction, unspecified: Secondary | ICD-10-CM | POA: Diagnosis not present

## 2024-07-01 LAB — CUP PACEART REMOTE DEVICE CHECK
Date Time Interrogation Session: 20260111230157
Implantable Pulse Generator Implant Date: 20210902

## 2024-07-02 ENCOUNTER — Ambulatory Visit: Payer: Self-pay | Admitting: Cardiology

## 2024-07-05 NOTE — Progress Notes (Signed)
 Remote Loop Recorder Transmission

## 2024-07-11 ENCOUNTER — Encounter

## 2024-07-18 ENCOUNTER — Other Ambulatory Visit: Payer: Self-pay | Admitting: Internal Medicine

## 2024-07-25 ENCOUNTER — Ambulatory Visit: Admitting: Internal Medicine

## 2024-07-30 ENCOUNTER — Encounter

## 2024-08-01 ENCOUNTER — Encounter

## 2024-08-01 ENCOUNTER — Ambulatory Visit

## 2024-08-12 ENCOUNTER — Ambulatory Visit: Admitting: Internal Medicine

## 2024-08-12 ENCOUNTER — Encounter

## 2024-08-30 ENCOUNTER — Encounter

## 2024-09-01 ENCOUNTER — Ambulatory Visit

## 2024-09-30 ENCOUNTER — Encounter

## 2024-10-02 ENCOUNTER — Ambulatory Visit

## 2024-10-31 ENCOUNTER — Encounter

## 2024-11-02 ENCOUNTER — Ambulatory Visit
